# Patient Record
Sex: Male | Born: 1950 | Race: White | Hispanic: No | Marital: Married | Smoking: Never smoker
Health system: Southern US, Community
[De-identification: ages and names within clinical notes are randomized; demographics above are authoritative.]

## PROBLEM LIST (undated history)

## (undated) DIAGNOSIS — K219 Gastro-esophageal reflux disease without esophagitis: Secondary | ICD-10-CM

## (undated) DIAGNOSIS — H18609 Keratoconus, unspecified, unspecified eye: Secondary | ICD-10-CM

## (undated) DIAGNOSIS — K635 Polyp of colon: Secondary | ICD-10-CM

## (undated) DIAGNOSIS — E7800 Pure hypercholesterolemia, unspecified: Secondary | ICD-10-CM

## (undated) DIAGNOSIS — T7840XA Allergy, unspecified, initial encounter: Secondary | ICD-10-CM

## (undated) DIAGNOSIS — I82409 Acute embolism and thrombosis of unspecified deep veins of unspecified lower extremity: Secondary | ICD-10-CM

## (undated) DIAGNOSIS — J189 Pneumonia, unspecified organism: Secondary | ICD-10-CM

## (undated) DIAGNOSIS — J309 Allergic rhinitis, unspecified: Secondary | ICD-10-CM

## (undated) DIAGNOSIS — K648 Other hemorrhoids: Secondary | ICD-10-CM

## (undated) DIAGNOSIS — C189 Malignant neoplasm of colon, unspecified: Secondary | ICD-10-CM

## (undated) HISTORY — DX: Keratoconus, unspecified, unspecified eye: H18.609

## (undated) HISTORY — DX: Allergic rhinitis, unspecified: J30.9

## (undated) HISTORY — DX: Polyp of colon: K63.5

## (undated) HISTORY — DX: Malignant neoplasm of colon, unspecified: C18.9

## (undated) HISTORY — DX: Gastro-esophageal reflux disease without esophagitis: K21.9

## (undated) HISTORY — DX: Pure hypercholesterolemia, unspecified: E78.00

## (undated) HISTORY — PX: WISDOM TOOTH EXTRACTION: SHX21

## (undated) HISTORY — PX: COLONOSCOPY: SHX174

## (undated) HISTORY — PX: KNEE ARTHROSCOPY: SUR90

## (undated) HISTORY — DX: Other hemorrhoids: K64.8

## (undated) HISTORY — DX: Allergy, unspecified, initial encounter: T78.40XA

---

## 2001-08-15 ENCOUNTER — Encounter: Payer: Self-pay | Admitting: Family Medicine

## 2001-08-15 ENCOUNTER — Ambulatory Visit (HOSPITAL_COMMUNITY): Admission: RE | Admit: 2001-08-15 | Discharge: 2001-08-15 | Payer: Self-pay | Admitting: Family Medicine

## 2004-08-08 ENCOUNTER — Ambulatory Visit: Payer: Self-pay | Admitting: Internal Medicine

## 2006-10-08 ENCOUNTER — Ambulatory Visit: Payer: Self-pay | Admitting: Internal Medicine

## 2006-10-21 ENCOUNTER — Ambulatory Visit: Payer: Self-pay | Admitting: Internal Medicine

## 2013-08-03 ENCOUNTER — Telehealth: Payer: Self-pay | Admitting: Internal Medicine

## 2013-08-03 NOTE — Telephone Encounter (Signed)
Patient states he went to PCP for physical and had + hemoccult. Last colonoscopy 2008, normal exam, internal hems. Patient wants to be seen. Scheduled on 08/11/13 at 8:45 AM.

## 2013-08-04 ENCOUNTER — Encounter: Payer: Self-pay | Admitting: *Deleted

## 2013-08-05 ENCOUNTER — Encounter: Payer: Self-pay | Admitting: *Deleted

## 2013-08-11 ENCOUNTER — Encounter: Payer: Self-pay | Admitting: Internal Medicine

## 2013-08-11 ENCOUNTER — Ambulatory Visit (INDEPENDENT_AMBULATORY_CARE_PROVIDER_SITE_OTHER): Payer: 59 | Admitting: Internal Medicine

## 2013-08-11 VITALS — BP 128/70 | HR 60 | Ht 72.0 in | Wt 208.0 lb

## 2013-08-11 DIAGNOSIS — R195 Other fecal abnormalities: Secondary | ICD-10-CM

## 2013-08-11 MED ORDER — PREPOPIK 10-3.5-12 MG-GM-GM PO PACK: 1.0000 | PACK | ORAL | Status: DC

## 2013-08-11 NOTE — Progress Notes (Signed)
Jay Pace 09-09-1951 MRN [id number]  History of Present Illness:  This is a 62 year old white male with a positive home stool test for occult blood. He reports constipation when he did the cards. He denies any lower GI symptoms. There is no family history of colon cancer. His recent complete physical exam by Dr. Tiburcio Pea was normal. He had a hemoglobin of 15 and hematocrit of 42.2. We did a colonoscopy  in January 2008 for neoplastic screening and he was found to have small internal hemorrhoids. He denies hematochezia, rectal pain, abdominal pain or weight loss. He denies taking any anti-inflammatory medications.   Past Medical History  Diagnosis Date  . Internal hemorrhoids   . Hypercholesterolemia   . Allergic rhinitis   . Keratoconus    Past Surgical History  Procedure Laterality Date  . Knee arthroscopy Right     reports that he has never smoked. He has never used smokeless tobacco. He reports that he drinks alcohol. He reports that he does not use illicit drugs. family history includes Bladder Cancer in his father; Breast cancer in his sister and sister; Hypertension in his brother; Liver cancer in his brother; Ovarian cancer in his sister; Stomach cancer in his maternal grandmother; Stroke in his father. No Known Allergies      Review of Systems:  The remainder of the 10 point ROS is negative except as outlined in H&P   Physical Exam: General appearance  Well developed, in no distress. Eyes- non icteric. HEENT nontraumatic, normocephalic. Mouth no lesions, tongue papillated, no cheilosis. Neck supple without adenopathy, thyroid not enlarged, no carotid bruits, no JVD. Lungs Clear to auscultation bilaterally. Cor normal S1, normal S2, regular rhythm, no murmur,  quiet precordium. Abdomen: Soft nontender abdomen with normal active bowel sounds. No distention. Liver edge at costal margin. No scars. Rectal: Normal perianal cardia. Normal rectal sphincter tone. Moderate  amount of soft brown Hemoccult negative stool. No external hemorrhoids. Extremities no pedal edema. Skin no lesions. Neurological alert and oriented x 3. Psychological normal mood and affect.  Assessment and Plan:  Problem #45 62 year old, white male with positive Hemoccult cards but no visible blood per rectum. I could not reproduce the  heme-positive stool on my exam today. His last colonoscopy was 7 years ago. He has asymptomatic internal hemorrhoids. Because of the concern for possible neoplasm, either a polyp or cancer, we will proceed with a colonoscopy. I explained to him that there is no way for me to tell where the blood came from but I feel that it was most likely from an anorectal source due to straining or internal hemorrhoids. He agrees to go ahead with a colonoscopy just to be sure.   08/11/2013 Lina Sar

## 2013-08-11 NOTE — Addendum Note (Signed)
Addended by: Richardson Chiquito on: 08/11/2013 02:36 PM   Modules accepted: Orders

## 2013-08-11 NOTE — Patient Instructions (Signed)
You have been scheduled for a colonoscopy with propofol. Please follow written instructions given to you at your visit today.  Please pick up your prep kit at the pharmacy within the next 1-3 days. If you use inhalers (even only as needed), please bring them with you on the day of your procedure. Your physician has requested that you go to www.startemmi.com and enter the access code given to you at your visit today. This web site gives a general overview about your procedure. However, you should still follow specific instructions given to you by our office regarding your preparation for the procedure.  Cc: Dr Holley Bouche

## 2013-08-12 ENCOUNTER — Encounter: Payer: Self-pay | Admitting: Internal Medicine

## 2013-09-15 ENCOUNTER — Encounter: Payer: 59 | Admitting: Internal Medicine

## 2013-10-05 ENCOUNTER — Telehealth: Payer: Self-pay | Admitting: *Deleted

## 2013-10-05 ENCOUNTER — Ambulatory Visit (AMBULATORY_SURGERY_CENTER): Payer: 59 | Admitting: Internal Medicine

## 2013-10-05 ENCOUNTER — Other Ambulatory Visit: Payer: Self-pay | Admitting: *Deleted

## 2013-10-05 ENCOUNTER — Other Ambulatory Visit (INDEPENDENT_AMBULATORY_CARE_PROVIDER_SITE_OTHER): Payer: 59

## 2013-10-05 ENCOUNTER — Encounter: Payer: Self-pay | Admitting: Internal Medicine

## 2013-10-05 ENCOUNTER — Telehealth: Payer: Self-pay | Admitting: Internal Medicine

## 2013-10-05 VITALS — BP 151/76 | HR 56 | Temp 97.7°F | Resp 16 | Ht 72.0 in | Wt 208.0 lb

## 2013-10-05 DIAGNOSIS — R195 Other fecal abnormalities: Secondary | ICD-10-CM

## 2013-10-05 DIAGNOSIS — C189 Malignant neoplasm of colon, unspecified: Secondary | ICD-10-CM

## 2013-10-05 DIAGNOSIS — R933 Abnormal findings on diagnostic imaging of other parts of digestive tract: Secondary | ICD-10-CM

## 2013-10-05 DIAGNOSIS — D126 Benign neoplasm of colon, unspecified: Secondary | ICD-10-CM

## 2013-10-05 LAB — CBC WITH DIFFERENTIAL/PLATELET
Basophils Absolute: 0 10*3/uL (ref 0.0–0.1)
Basophils Relative: 0.3 % (ref 0.0–3.0)
Eosinophils Absolute: 0 10*3/uL (ref 0.0–0.7)
Eosinophils Relative: 0.5 % (ref 0.0–5.0)
HCT: 42.6 % (ref 39.0–52.0)
Hemoglobin: 14.6 g/dL (ref 13.0–17.0)
Lymphocytes Relative: 27.5 % (ref 12.0–46.0)
Lymphs Abs: 2.1 10*3/uL (ref 0.7–4.0)
MCHC: 34.3 g/dL (ref 30.0–36.0)
MCV: 94.3 fl (ref 78.0–100.0)
Monocytes Absolute: 0.6 10*3/uL (ref 0.1–1.0)
Monocytes Relative: 7.4 % (ref 3.0–12.0)
Neutro Abs: 5 10*3/uL (ref 1.4–7.7)
Neutrophils Relative %: 64.3 % (ref 43.0–77.0)
Platelets: 219 10*3/uL (ref 150.0–400.0)
RBC: 4.52 Mil/uL (ref 4.22–5.81)
RDW: 12.9 % (ref 11.5–14.6)
WBC: 7.8 10*3/uL (ref 4.5–10.5)

## 2013-10-05 LAB — COMPREHENSIVE METABOLIC PANEL
ALT: 21 U/L (ref 0–53)
AST: 16 U/L (ref 0–37)
Albumin: 4 g/dL (ref 3.5–5.2)
Alkaline Phosphatase: 65 U/L (ref 39–117)
BUN: 11 mg/dL (ref 6–23)
CO2: 28 mEq/L (ref 19–32)
Calcium: 8.7 mg/dL (ref 8.4–10.5)
Chloride: 106 mEq/L (ref 96–112)
Creatinine, Ser: 0.8 mg/dL (ref 0.4–1.5)
GFR: 104.1 mL/min (ref >60.00)
Glucose, Bld: 88 mg/dL (ref 70–99)
Potassium: 4.5 mEq/L (ref 3.5–5.1)
Sodium: 140 mEq/L (ref 135–145)
Total Bilirubin: 1 mg/dL (ref 0.3–1.2)
Total Protein: 7 g/dL (ref 6.0–8.3)

## 2013-10-05 LAB — IBC PANEL
Iron: 59 ug/dL (ref 42–165)
Saturation Ratios: 17.9 % — ABNORMAL LOW (ref 20.0–50.0)
Transferrin: 235.6 mg/dL (ref 212.0–360.0)

## 2013-10-05 MED ORDER — SODIUM CHLORIDE 0.9 % IV SOLN: 500.0000 mL | INTRAVENOUS | Status: DC

## 2013-10-05 NOTE — Progress Notes (Signed)
Called to room to assist during endoscopic procedure.  Patient ID and intended procedure confirmed with present staff. Received instructions for my participation in the procedure from the performing physician.  

## 2013-10-05 NOTE — Progress Notes (Signed)
Patient did not experience any of the following events: a burn prior to discharge; a fall within the facility; wrong site/side/patient/procedure/implant event; or a hospital transfer or hospital admission upon discharge from the facility. (G8907)Patient did not have preoperative order for IV antibiotic SSI prophylaxis. (G8918) ewm 

## 2013-10-05 NOTE — Progress Notes (Signed)
A/ox3 pleased with MAC, report to Marie RN 

## 2013-10-05 NOTE — Telephone Encounter (Signed)
Patient asking if he has any path, can it be sent to [facility] with Edmon Crape, RN and we cannot send it out to them due to a contract with [facility]. Patient aware.

## 2013-10-05 NOTE — Op Note (Signed)
[facility]  [address]. [place], [address]   COLONOSCOPY PROCEDURE REPORT  PATIENT: Jay Pace, Jay Pace  MR#: [id number] BIRTHDATE: Oct 22, 1950 , 62  yrs. old GENDER: Male ENDOSCOPIST: Lafayette Dragon, MD REFERRED IW:[id number] Kenton Kingfisher, M.D. PROCEDURE DATE:  10/05/2013 PROCEDURE:   Colonoscopy with snare polypectomy and Colonoscopy with cold biopsy polypectomy First Screening Colonoscopy - Avg.  risk and is 50 yrs.  old or older - No.  Prior Negative Screening - Now for repeat screening. Other: See Comments  History of Adenoma - Now for follow-up colonoscopy & has been > or = to 3 yrs.  N/A  Polyps Removed Today? Yes. ASA CLASS:   Class II INDICATIONS:heme-positive stool, prior colonoscopy in 1/ 2008 was normal except for hemorrhoids hemoglobin 15 ,no symptoms.   , MEDICATIONS: MAC sedation, administered by CRNA and propofol (Diprivan) 400mg  IV  DESCRIPTION OF PROCEDURE:   After the risks benefits and alternatives of the procedure were thoroughly explained, informed consent was obtained.  A digital rectal exam revealed no abnormalities of the rectum.   The LB [id number]  endoscope was introduced through the anus and advanced to the cecum, which was identified by both the appendix and ileocecal valve. No adverse events experienced.   The quality of the prep was good, using MoviPrep  The instrument was then slowly withdrawn as the colon was fully examined.      COLON FINDINGS: A fungating sessile polyp measuring 3 cm in size with a friable surface was found in the descending colon.at 70 cm polyp was friable and too large to snare in one piece. The base of the polyp was rather spread and a masslike A partial piecemeal polypectomy was performed using snare cautery and with cold forceps from the base of the polyp 2 rule out for cancer..  The resection was incomplete and the polyp tissue was  retrieved.there was small amount of bleeding from the polypectomy  site only about 50% of the polyp was removed the remaining 50% loss and masslike nodule or friable tissue at 70 cm covering the cardia of about 3 cmSpot tattoo total of 4 cc were applied to the lesion, also one Endo Clip was placed in the base of the polyp  Retroflexed views revealed no abnormalities. The time to cecum=4 minutes 10 seconds.  Withdrawal time=21 minutes 01 seconds.  The scope was withdrawn and the procedure completed. COMPLICATIONS: There were no complications.  ENDOSCOPIC IMPRESSION: Giant Sessile polyp measuring 3 cm in size was found in the descending colon;at 70 cm. polypectomy was performed using snare cautery and with cold forceps ,s/p Tattoo  and Endo clip placement  RECOMMENDATIONS: 1.  Await biopsy results 2.  pending on the pathology we will repeat the flexible sigmoidoscopy within the 4-6 weeks.  If there is a dysplasia or carcinoma he will be referred for surgical resection. We will proceed with CT scan of the abdomen and pelvis with IV and oral contrast Obtain baseline chemistries today Avoid aspirin and anti-inflammatory agents the next 2 weeks   eSigned:  Lafayette Dragon, MD 10/05/2013 4:21 PM   cc:   PATIENT NAME:  Jay Pace, Jay Pace MR#: [id number]

## 2013-10-05 NOTE — Telephone Encounter (Signed)
Per Dr Olevia Perches, patient needs CT scan abd, pelvis Left colon polyp versus mass/cancer. Labs today, CMET, CBC, Iron panel. Scheduled CT at [facility] on 10/08/13 at 2:30 PM. NPO 4 hours prior. Contrast at 12:30 PM and 1:30 PM. Lelan Pons in recovery at [facility] will give information to patient.

## 2013-10-05 NOTE — Patient Instructions (Addendum)
YOU HAD AN ENDOSCOPIC PROCEDURE TODAY AT [facility]: Refer to the procedure report that was given to you for any specific questions about what was found during the examination.  If the procedure report does not answer your questions, please call your gastroenterologist to clarify.  If you requested that your care partner not be given the details of your procedure findings, then the procedure report has been included in a sealed envelope for you to review at your convenience later.  YOU SHOULD EXPECT: Some feelings of bloating in the abdomen. Passage of more gas than usual.  Walking can help get rid of the air that was put into your GI tract during the procedure and reduce the bloating. If you had a lower endoscopy (such as a colonoscopy or flexible sigmoidoscopy) you may notice spotting of blood in your stool or on the toilet paper. If you underwent a bowel prep for your procedure, then you may not have a normal bowel movement for a few days.  DIET: Your first meal following the procedure should be a light meal and then it is ok to progress to your normal diet.  A half-sandwich or bowl of soup is an example of a good first meal.  Heavy or fried foods are harder to digest and may make you feel nauseous or bloated.  Likewise meals heavy in dairy and vegetables can cause extra gas to form and this can also increase the bloating.  Drink plenty of fluids but you should avoid alcoholic beverages for 24 hours.  ACTIVITY: Your care partner should take you home directly after the procedure.  You should plan to take it easy, moving slowly for the rest of the day.  You can resume normal activity the day after the procedure however you should NOT DRIVE or use heavy machinery for 24 hours (because of the sedation medicines used during the test).    SYMPTOMS TO REPORT IMMEDIATELY: A gastroenterologist can be reached at any hour.  During normal business hours, 8:30 AM to 5:00 PM Monday through Friday,  call [phone].  After hours and on weekends, please call the GI answering service at [phone]  Emergency number who will take a message and have the physician on call contact you.   Following lower endoscopy (colonoscopy or flexible sigmoidoscopy):  Excessive amounts of blood in the stool  Significant tenderness or worsening of abdominal pains  Swelling of the abdomen that is new, acute  Fever of 100F or higher  FOLLOW UP: If any biopsies were taken you will be contacted by phone or by letter within the next 1-3 weeks.  Call your gastroenterologist if you have not heard about the biopsies in 3 weeks.  Our staff will call the home number listed on your records the next business day following your procedure to check on you and address any questions or concerns that you may have at that time regarding the information given to you following your procedure. This is a courtesy call and so if there is no answer at the home number and we have not heard from you through the emergency physician on call, we will assume that you have returned to your regular daily activities without incident.  SIGNATURES/CONFIDENTIALITY: You and/or your care partner have signed paperwork which will be entered into your electronic medical record.  These signatures attest to the fact that that the information above on your After Visit Summary has been reviewed and is understood.  Full responsibility of the  confidentiality of this discharge information lies with you and/or your care-partner.   You will need to have labs drawn today before discharge home due to needing kidney function results prior to the CT scan Friday  We have scheduled you a CT scan for Friday 10-08-2013 at 230 pm at [facility] on [address] 3rd floor.  The same building as [facility] cardiology..see attached map.  Please follow the ct form schedule for drinking the contrast.  You have a  Metal clip at 70 cm in the colon.  Please let anyone be aware of this with xrays,scans, mri's  etc.  This will pass in the stool in the next 2 weeks. You may see this or not. Carry your card for the next month.  No aspirin, aspirin containing  Products or anti inflammatory medicines for the next 2 weeks. You may use tylenol only for pain the next 2 weeks. You may resume these medicines on 10-19-2013.

## 2013-10-06 ENCOUNTER — Telehealth: Payer: Self-pay | Admitting: *Deleted

## 2013-10-06 NOTE — Telephone Encounter (Signed)
  Follow up Call-  Call back number 10/05/2013  Post procedure Call Back phone  # [phone]  Permission to leave phone message Yes     Patient questions:  Do you have a fever, pain , or abdominal swelling? no Pain Score  0 *  Have you tolerated food without any problems? yes  Have you been able to return to your normal activities? yes  Do you have any questions about your discharge instructions: Diet   no Medications  no Follow up visit  no  Do you have questions or concerns about your Care? no  Actions: * If pain score is 4 or above: No action needed, pain <4.

## 2013-10-08 ENCOUNTER — Ambulatory Visit (INDEPENDENT_AMBULATORY_CARE_PROVIDER_SITE_OTHER)
Admission: RE | Admit: 2013-10-08 | Discharge: 2013-10-08 | Disposition: A | Discharge status: Home / Self Care | Payer: 59 | Source: Ambulatory Visit | Attending: Internal Medicine | Admitting: Internal Medicine

## 2013-10-08 ENCOUNTER — Telehealth: Payer: Self-pay | Admitting: Internal Medicine

## 2013-10-08 DIAGNOSIS — R933 Abnormal findings on diagnostic imaging of other parts of digestive tract: Secondary | ICD-10-CM

## 2013-10-08 MED ORDER — IOHEXOL 300 MG/ML  SOLN
100.0000 mL | Freq: Once | INTRAMUSCULAR | Status: AC | PRN
  Administered 2013-10-08: 100 mL via INTRAVENOUS

## 2013-10-08 NOTE — Telephone Encounter (Signed)
Left a message for patient to call me. 

## 2013-10-08 NOTE — Telephone Encounter (Signed)
Awaiting path report.

## 2013-10-08 NOTE — Telephone Encounter (Signed)
Spoke with patient and told him the results are not back yet. He just wants Dr. Olevia Perches to know he is anxious to hear the results.

## 2013-10-12 ENCOUNTER — Other Ambulatory Visit: Payer: Self-pay | Admitting: *Deleted

## 2013-10-12 ENCOUNTER — Encounter: Payer: Self-pay | Admitting: Internal Medicine

## 2013-10-12 DIAGNOSIS — C801 Malignant (primary) neoplasm, unspecified: Secondary | ICD-10-CM

## 2013-10-13 ENCOUNTER — Encounter: Payer: Self-pay | Admitting: *Deleted

## 2013-10-20 ENCOUNTER — Other Ambulatory Visit (INDEPENDENT_AMBULATORY_CARE_PROVIDER_SITE_OTHER): Payer: Self-pay | Admitting: General Surgery

## 2013-10-20 ENCOUNTER — Ambulatory Visit (INDEPENDENT_AMBULATORY_CARE_PROVIDER_SITE_OTHER): Payer: 59 | Admitting: General Surgery

## 2013-10-20 ENCOUNTER — Encounter (INDEPENDENT_AMBULATORY_CARE_PROVIDER_SITE_OTHER): Payer: Self-pay

## 2013-10-20 ENCOUNTER — Telehealth (INDEPENDENT_AMBULATORY_CARE_PROVIDER_SITE_OTHER): Payer: Self-pay | Admitting: *Deleted

## 2013-10-20 ENCOUNTER — Encounter (INDEPENDENT_AMBULATORY_CARE_PROVIDER_SITE_OTHER): Payer: Self-pay | Admitting: General Surgery

## 2013-10-20 VITALS — BP 148/80 | HR 56 | Temp 98.2°F | Resp 14 | Ht 72.0 in | Wt 213.8 lb

## 2013-10-20 DIAGNOSIS — C189 Malignant neoplasm of colon, unspecified: Secondary | ICD-10-CM

## 2013-10-20 NOTE — Telephone Encounter (Signed)
I spoke with pt and informed him of the appt for his MRI at [facility] on 10/25/13 with an arrival time of 7:45pm.  I instructed pt to be NPO 4 hours prior to scan.  Pt is agreeable with this appt.

## 2013-10-20 NOTE — Patient Instructions (Signed)
We will call you with the results of the MRI.    CENTRAL  SURGERY  ONE-DAY (1) PRE-OP HOME COLON PREP INSTRUCTIONS: ** MIRALAX / GATORADE PREP **  Fill the two prescriptions at a pharmacy of your choice.  You must follow the instructions below carefully.  If you have questions or problems, please call and speak to someone in the clinic department at our office:   [phone].  MIRALAX - GATORADE -- DULCOLAX TABS:   Fill the prescriptions for MIRALAX  (255 gm bottle)    In addition, purchase four (4) DULCOLAX TABLETS (no prescription required), and one 64 oz GATORADE.  (Do NOT purchase red Gatorade; any other flavor is acceptable).  ANITIBIOTICS:   There will be 2 different antibiotics.     Take both prescriptions THE AFTERNOON BEFORE your surgery, at the times written on the bottles.  INSTRUCTIONS: 1. Five days prior to your procedure do not eat nuts, popcorn, or fruit with seeds.  Stop all fiber supplements such as Metamucil, Citrucel, etc.  2. The day before your procedure: o 6:00am:  take (4) Dulcolax tablets.  You should remain on clear liquids for the entire day.   CLEAR LIQUIDS: clear bouillon, broth, jello (NOT RED), black coffee, tea, soda, etc o 10:00am:  add the bottle of MiraLax to the 64-oz bottle of Gatorade, and dissolve.  Begin drinking the Gatorade mixture until gone (8 oz every 15-30 minutes).  Continue clear liquids until midnight (or bedtime). o Take the antibiotics at the times instructed on the bottles.  3. The day of your procedure:   Do not eat or drink ANYTHING after midnight before your surgery.     If you take Heart or Blood Pressure medicine, ask the pre-op nurses about these during your preop appointment.   Further pre-operative instructions will be given to you from the hospital.   Expect to be contacted 5-7 days before your surgery.

## 2013-10-20 NOTE — Progress Notes (Signed)
Patient ID: Jay Pace, male   DOB: June 17, 1951, 63 y.o.   MRN: [id number]  Chief Complaint  Patient presents with  . New Evaluation    eval adeno carcinoma / colon resection?    HPI Jay Pace is a 63 y.o. male.   HPI  He is referred by Dr. Olevia Perches for further evaluation and treatment of a new diagnosed left colon cancer.  He was noted to have Hemoccult positive stool on the Hemoccult cards given to him and his annual physical exam by Dr. Kenton Kingfisher. He went and had a colonoscopy by Dr. Olevia Perches.  This demonstrated a large polypoid mass in the left colon area near the splenic flexure. 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 A tattoo was placed around the area as well as a clip. A CT scan was performed. This demonstrated a small kidney cyst. There is a large prostate gland. There were small lesions in the liver that could not be characterized. It was recommended that if the biopsy was positive for malignancy an MRI be performed to better characterize these lesions. He has been feeling well and eating well. No decrease in energy. No change in bowel habits.  There is no family history of colon cancer. He is here with his wife.  Past Medical History  Diagnosis Date  . Internal hemorrhoids   . Hypercholesterolemia   . Allergic rhinitis   . Keratoconus     Past Surgical History  Procedure Laterality Date  . Knee arthroscopy Right     Family History  Problem Relation Age of Onset  . Stroke Father   . Bladder Cancer Father   . Cancer Father     bladder  . Breast cancer Sister   . Cancer Sister     breast and ovarian  . Hypertension Brother   . Stomach cancer Maternal Grandmother   . Liver cancer Brother   . Ovarian cancer Sister   . Cancer Sister     breast  . Breast cancer Sister     x 2    Social History History  Substance Use Topics  . Smoking status: Never Smoker   . Smokeless tobacco: Never Used  . Alcohol Use: No     Comment: rare    No Known  Allergies  No current outpatient prescriptions on file.   No current facility-administered medications for this visit.    Review of Systems Review of Systems  Constitutional: Negative.   HENT: Positive for congestion.   Respiratory: Negative.   Cardiovascular: Negative.   Gastrointestinal: Positive for blood in stool. Negative for abdominal pain.  Genitourinary:       Nocturia twice a night. Slight elevation in PSA. Dr. Kenton Kingfisher recommended he see a urologist but he did not want to do that at the time.  Musculoskeletal: Negative.   Skin: Negative.   Neurological: Negative.   Hematological: Negative.     Blood pressure 148/80, pulse 56, temperature 98.2 F (36.8 C), temperature source Temporal, resp. rate 14, height 6' (1.829 m), weight 213 lb 12.8 oz (96.979 kg).  Physical Exam Physical Exam  Constitutional: He appears well-developed and well-nourished. No distress.  HENT:  Head: Normocephalic and atraumatic.  Eyes: No scleral icterus.  Cardiovascular: Normal rate and regular rhythm.   Pulmonary/Chest: Effort normal and breath sounds normal.  Abdominal: Soft. Bowel sounds are normal. He exhibits no mass. There is no tenderness.  Musculoskeletal: He exhibits no edema.  Lymphadenopathy:    He has no cervical adenopathy.  Neurological: He is alert.  Skin: Skin is warm and dry.  Psychiatric: He has a normal mood and affect. His behavior is normal.    Data Reviewed Colonoscopy report. Note from Dr. Olevia Perches. CT scan and CT report.  Assessment    Newly diagnosed left colon cancer. Indeterminate liver lesions on CT scan. Symptoms of BPH as well as enlarged prostate and slightly elevated PSA.     Plan    I recommended an MRI to further characterize the liver lesions. If one of them looked suspicious, may need to get an image guided biopsy. I also recommended that he re-consider seeing a urologist.   As for the newly diagnosed colon cancer, I recommend laparoscopic partial  colectomy.  I have explained the procedure and risks of colon resection.  Risks include but are not limited to bleeding, infection, wound problems, anesthesia, anastomotic leak, need for colostomy, injury to intraabominal organs (such as intestine, spleen, kidney, bladder, ureter, etc.), ileus, irregular bowel habits.  He seems to understand and agrees with the plan.  Will await the results of the MRI.       Jay Pace J 10/20/2013, 12:44 PM

## 2013-10-21 ENCOUNTER — Telehealth (INDEPENDENT_AMBULATORY_CARE_PROVIDER_SITE_OTHER): Payer: Self-pay

## 2013-10-21 ENCOUNTER — Telehealth: Payer: Self-pay | Admitting: Internal Medicine

## 2013-10-21 NOTE — Telephone Encounter (Signed)
No problem with the clip. I have talked to the radiologist Dr Kris Hartmann and he says titanium metal clips don/t interfere with MRI. The concern that the clips could be displaced by the magnet in MRI  and cause  damage to the tissue they hold together, is not a concern because this clip  Is there just for marking the site of the tumor.

## 2013-10-21 NOTE — Telephone Encounter (Signed)
Pt reports that he has a "metal clip" placed by Dr. Olevia Perches on 10/05/13.  Her office confirmed.  Per Dr. Olevia Perches, clip is titanium which is safe for MRI.  Pt made aware.

## 2013-10-21 NOTE — Telephone Encounter (Signed)
Spoke with Jearld Fenton at Dr. Bertrum Sol office. She states he wants the patient to have an MRI. Patient had an endo clip placed on polyp on 10/05/13. She is asking if he can have an MRI since this was placed and if not, when could he have one. Please, advise

## 2013-10-25 ENCOUNTER — Ambulatory Visit
Admission: RE | Admit: 2013-10-25 | Discharge: 2013-10-25 | Disposition: A | Discharge status: Home / Self Care | Payer: 59 | Source: Ambulatory Visit | Attending: General Surgery | Admitting: General Surgery

## 2013-10-25 DIAGNOSIS — C189 Malignant neoplasm of colon, unspecified: Secondary | ICD-10-CM

## 2013-10-25 MED ORDER — GADOBENATE DIMEGLUMINE 529 MG/ML IV SOLN
20.0000 mL | Freq: Once | INTRAVENOUS | Status: AC | PRN
  Administered 2013-10-25: 20 mL via INTRAVENOUS

## 2013-10-26 ENCOUNTER — Encounter (INDEPENDENT_AMBULATORY_CARE_PROVIDER_SITE_OTHER): Payer: Self-pay | Admitting: General Surgery

## 2013-10-26 ENCOUNTER — Telehealth (INDEPENDENT_AMBULATORY_CARE_PROVIDER_SITE_OTHER): Payer: Self-pay

## 2013-10-26 NOTE — Progress Notes (Signed)
Patient ID: Jay Pace, male   DOB: March 12, 1951, 63 y.o.   MRN: [id number] The MRI of the liver shows that the lesions are most consistent with benign hemangiomas. No evidence of metastatic disease. I discussed this with him. He still working on trying to get into see a urologist. I spoke with him about going ahead and scheduling his operation and he is in agreement with this.

## 2013-10-26 NOTE — Telephone Encounter (Signed)
LMOV to call.  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  No cancer.

## 2013-11-01 ENCOUNTER — Encounter (HOSPITAL_COMMUNITY): Payer: Self-pay | Admitting: Pharmacy Technician

## 2013-11-02 ENCOUNTER — Other Ambulatory Visit (INDEPENDENT_AMBULATORY_CARE_PROVIDER_SITE_OTHER): Payer: Self-pay | Admitting: General Surgery

## 2013-11-02 NOTE — Progress Notes (Signed)
Dr Zella Richer -- Consent order stated [facility].     Not overstepping my bounds, but did you want colectoly added?  If so, please put in EPIC.  If not, Thank you.  Has PST APPT 11/03/13

## 2013-11-02 NOTE — Patient Instructions (Addendum)
      Your procedure is scheduled on:  11/09/13  TUESDAY  Report to [facility] at  0730     AM.  Call this number if you have problems the morning of surgery: [phone]        BOWEL PREP AS PER OFFICE  Do not take anything by mouth :After Midnight. Monday NIGHT   Take these medicines the morning of surgery with A SIP OF WATER: NONE CONTACTS MUST BE REMOVED BEFORE SURGERY  .  Contacts, dentures or partial plates, or metal hairpins  can not be worn to surgery. Your family will be responsible for glasses, dentures, hearing aides while you are in surgery  Leave suitcase in the car. After surgery it may be brought to your room.  For patients admitted to the hospital, checkout time is 11:00 AM day of  discharge.                DO NOT WEAR JEWELRY, LOTIONS, POWDERS, OR PERFUMES.  WOMEN-- DO NOT SHAVE LEGS OR UNDERARMS FOR 48 HOURS BEFORE SHOWERS. MEN MAY SHAVE FACE.  Patients discharged the day of surgery will not be allowed to drive home. IF going home the day of surgery, you must have a driver and someone to stay with you for the first 24 hours  Name and phone number of your driver:                                                                        Please read over the following fact sheets that you were given: Blood Transfusion Sheet  Information    Carnie Bruemmer  PST 336  [phone]                 [place]                                                  Patient Signature _____________________________

## 2013-11-03 ENCOUNTER — Encounter (HOSPITAL_COMMUNITY)
Admission: RE | Admit: 2013-11-03 | Discharge: 2013-11-03 | Disposition: A | Discharge status: Home / Self Care | Payer: 59 | Source: Ambulatory Visit | Attending: General Surgery | Admitting: General Surgery

## 2013-11-03 ENCOUNTER — Ambulatory Visit (HOSPITAL_COMMUNITY)
Admission: RE | Admit: 2013-11-03 | Discharge: 2013-11-03 | Disposition: A | Discharge status: Home / Self Care | Payer: 59 | Source: Ambulatory Visit | Attending: General Surgery | Admitting: General Surgery

## 2013-11-03 ENCOUNTER — Encounter (HOSPITAL_COMMUNITY): Payer: Self-pay

## 2013-11-03 DIAGNOSIS — Z01818 Encounter for other preprocedural examination: Secondary | ICD-10-CM | POA: Insufficient documentation

## 2013-11-03 DIAGNOSIS — C189 Malignant neoplasm of colon, unspecified: Secondary | ICD-10-CM | POA: Insufficient documentation

## 2013-11-03 DIAGNOSIS — Z01812 Encounter for preprocedural laboratory examination: Secondary | ICD-10-CM | POA: Insufficient documentation

## 2013-11-03 LAB — COMPREHENSIVE METABOLIC PANEL
ALT: 19 U/L (ref 0–53)
AST: 16 U/L (ref 0–37)
Albumin: 4.1 g/dL (ref 3.5–5.2)
Alkaline Phosphatase: 68 U/L (ref 39–117)
BUN: 16 mg/dL (ref 6–23)
CO2: 27 mEq/L (ref 19–32)
Calcium: 9.4 mg/dL (ref 8.4–10.5)
Chloride: 104 mEq/L (ref 96–112)
Creatinine, Ser: 0.8 mg/dL (ref 0.50–1.35)
GFR calc Af Amer: >90 mL/min (ref >90)
GFR calc non Af Amer: >90 mL/min (ref >90)
Glucose, Bld: 97 mg/dL (ref 70–99)
Potassium: 4.1 mEq/L (ref 3.7–5.3)
Sodium: 141 mEq/L (ref 137–147)
Total Bilirubin: 0.5 mg/dL (ref 0.3–1.2)
Total Protein: 7.5 g/dL (ref 6.0–8.3)

## 2013-11-03 LAB — ABO/RH: ABO/RH(D): A POS

## 2013-11-03 LAB — CBC WITH DIFFERENTIAL/PLATELET
Basophils Absolute: 0 10*3/uL (ref 0.0–0.1)
Basophils Relative: 0 % (ref 0–1)
Eosinophils Absolute: 0.1 10*3/uL (ref 0.0–0.7)
Eosinophils Relative: 2 % (ref 0–5)
HCT: 44.1 % (ref 39.0–52.0)
Hemoglobin: 15 g/dL (ref 13.0–17.0)
Lymphocytes Relative: 35 % (ref 12–46)
Lymphs Abs: 1.6 10*3/uL (ref 0.7–4.0)
MCH: 32.3 pg (ref 26.0–34.0)
MCHC: 34 g/dL (ref 30.0–36.0)
MCV: 94.8 fL (ref 78.0–100.0)
Monocytes Absolute: 0.3 10*3/uL (ref 0.1–1.0)
Monocytes Relative: 7 % (ref 3–12)
Neutro Abs: 2.7 10*3/uL (ref 1.7–7.7)
Neutrophils Relative %: 57 % (ref 43–77)
Platelets: 232 10*3/uL (ref 150–400)
RBC: 4.65 MIL/uL (ref 4.22–5.81)
RDW: 12.7 % (ref 11.5–15.5)
WBC: 4.7 10*3/uL (ref 4.0–10.5)

## 2013-11-03 LAB — PROTIME-INR
INR: 0.96 (ref 0.00–1.49)
Prothrombin Time: 12.6 seconds (ref 11.6–15.2)

## 2013-11-03 LAB — CEA: CEA: 3 ng/mL (ref 0.0–5.0)

## 2013-11-04 ENCOUNTER — Telehealth (INDEPENDENT_AMBULATORY_CARE_PROVIDER_SITE_OTHER): Payer: Self-pay | Admitting: *Deleted

## 2013-11-04 ENCOUNTER — Telehealth (INDEPENDENT_AMBULATORY_CARE_PROVIDER_SITE_OTHER): Payer: Self-pay

## 2013-11-04 NOTE — Telephone Encounter (Signed)
Patient called to ask about the antibiotics to go along with his bowel prep.  Explained that we will check with Dr. Zella Richer this afternoon then let him know.  Patient states understanding and agreeable at this time.

## 2013-11-04 NOTE — Telephone Encounter (Signed)
LM for pt that we would mail the Rx for preop antibiotics today.

## 2013-11-04 NOTE — Telephone Encounter (Signed)
Needs Erythromycin and Neomycin.  Jay Pace should have the pre-printed prescription.

## 2013-11-05 NOTE — Telephone Encounter (Signed)
Pt returned call and advised Rx is in the mail to him. Pt to call 8:30 am Monday if he did not received Rx.

## 2013-11-08 MED ORDER — DEXTROSE 5 % IV SOLN
2.0000 g | Freq: Once | INTRAVENOUS | Status: DC
  Filled 2013-11-08: qty 2

## 2013-11-09 ENCOUNTER — Encounter (HOSPITAL_COMMUNITY)
Admission: RE | Disposition: A | Discharge status: Home / Self Care | Payer: Self-pay | Source: Ambulatory Visit | Attending: General Surgery

## 2013-11-09 ENCOUNTER — Encounter (HOSPITAL_COMMUNITY): Payer: Self-pay | Admitting: *Deleted

## 2013-11-09 ENCOUNTER — Inpatient Hospital Stay (HOSPITAL_COMMUNITY): Payer: 59 | Admitting: Registered Nurse

## 2013-11-09 ENCOUNTER — Encounter (HOSPITAL_COMMUNITY): Payer: 59 | Admitting: Registered Nurse

## 2013-11-09 ENCOUNTER — Inpatient Hospital Stay (HOSPITAL_COMMUNITY)
Admission: RE | Admit: 2013-11-09 | Discharge: 2013-11-13 | DRG: 330 | Disposition: A | Discharge status: Home / Self Care | Payer: 59 | Source: Ambulatory Visit | Attending: General Surgery | Admitting: General Surgery

## 2013-11-09 DIAGNOSIS — Z800 Family history of malignant neoplasm of digestive organs: Secondary | ICD-10-CM

## 2013-11-09 DIAGNOSIS — R195 Other fecal abnormalities: Secondary | ICD-10-CM | POA: Diagnosis present

## 2013-11-09 DIAGNOSIS — C189 Malignant neoplasm of colon, unspecified: Secondary | ICD-10-CM

## 2013-11-09 DIAGNOSIS — E7800 Pure hypercholesterolemia, unspecified: Secondary | ICD-10-CM | POA: Diagnosis present

## 2013-11-09 DIAGNOSIS — K7689 Other specified diseases of liver: Secondary | ICD-10-CM | POA: Diagnosis present

## 2013-11-09 DIAGNOSIS — C186 Malignant neoplasm of descending colon: Principal | ICD-10-CM | POA: Diagnosis present

## 2013-11-09 DIAGNOSIS — Z8052 Family history of malignant neoplasm of bladder: Secondary | ICD-10-CM

## 2013-11-09 DIAGNOSIS — K560 Paralytic ileus: Secondary | ICD-10-CM | POA: Diagnosis not present

## 2013-11-09 DIAGNOSIS — Q619 Cystic kidney disease, unspecified: Secondary | ICD-10-CM

## 2013-11-09 HISTORY — PX: LAPAROSCOPIC PARTIAL COLECTOMY: SHX5907

## 2013-11-09 LAB — TYPE AND SCREEN
ABO/RH(D): A POS
Antibody Screen: NEGATIVE

## 2013-11-09 SURGERY — LAPAROSCOPIC PARTIAL COLECTOMY
Anesthesia: General | Site: Abdomen

## 2013-11-09 MED ORDER — DIPHENHYDRAMINE HCL 50 MG/ML IJ SOLN
12.5000 mg | Freq: Four times a day (QID) | INTRAMUSCULAR | Status: DC | PRN
  Administered 2013-11-10: 12.5 mg via INTRAVENOUS
  Filled 2013-11-09: qty 1

## 2013-11-09 MED ORDER — ACETAMINOPHEN 10 MG/ML IV SOLN
1000.0000 mg | Freq: Once | INTRAVENOUS | Status: AC
  Administered 2013-11-09: 1000 mg via INTRAVENOUS
  Filled 2013-11-09 (×2): qty 100

## 2013-11-09 MED ORDER — NEOSTIGMINE METHYLSULFATE 1 MG/ML IJ SOLN
INTRAMUSCULAR | Status: DC | PRN
  Administered 2013-11-09: 5 mg via INTRAVENOUS

## 2013-11-09 MED ORDER — EPHEDRINE SULFATE 50 MG/ML IJ SOLN
INTRAMUSCULAR | Status: DC | PRN
  Administered 2013-11-09: 5 mg via INTRAVENOUS

## 2013-11-09 MED ORDER — ONDANSETRON HCL 4 MG PO TABS: 4.0000 mg | ORAL_TABLET | Freq: Four times a day (QID) | ORAL | Status: DC | PRN

## 2013-11-09 MED ORDER — GLYCOPYRROLATE 0.2 MG/ML IJ SOLN
INTRAMUSCULAR | Status: AC
  Filled 2013-11-09: qty 3

## 2013-11-09 MED ORDER — PROPOFOL 10 MG/ML IV BOLUS
INTRAVENOUS | Status: DC | PRN
  Administered 2013-11-09: 200 mg via INTRAVENOUS

## 2013-11-09 MED ORDER — ONDANSETRON HCL 4 MG/2ML IJ SOLN
INTRAMUSCULAR | Status: AC
  Filled 2013-11-09: qty 2

## 2013-11-09 MED ORDER — DIPHENHYDRAMINE HCL 12.5 MG/5ML PO ELIX: 12.5000 mg | ORAL_SOLUTION | Freq: Four times a day (QID) | ORAL | Status: DC | PRN

## 2013-11-09 MED ORDER — TAMSULOSIN HCL 0.4 MG PO CAPS
0.4000 mg | ORAL_CAPSULE | Freq: Every day | ORAL | Status: DC
Start: 2013-11-09 — End: 2013-11-13
  Administered 2013-11-09 – 2013-11-12 (×4): 0.4 mg via ORAL
  Filled 2013-11-09 (×5): qty 1

## 2013-11-09 MED ORDER — PANTOPRAZOLE SODIUM 40 MG IV SOLR
40.0000 mg | INTRAVENOUS | Status: DC
  Administered 2013-11-09 – 2013-11-12 (×4): 40 mg via INTRAVENOUS
  Filled 2013-11-09 (×5): qty 40

## 2013-11-09 MED ORDER — LACTATED RINGERS IR SOLN
Status: DC | PRN
  Administered 2013-11-09: 1

## 2013-11-09 MED ORDER — MORPHINE SULFATE (PF) 1 MG/ML IV SOLN
INTRAVENOUS | Status: AC
  Filled 2013-11-09: qty 25

## 2013-11-09 MED ORDER — NALOXONE HCL 0.4 MG/ML IJ SOLN: 0.4000 mg | INTRAMUSCULAR | Status: DC | PRN

## 2013-11-09 MED ORDER — SODIUM CHLORIDE 0.9 % IJ SOLN: 9.0000 mL | INTRAMUSCULAR | Status: DC | PRN

## 2013-11-09 MED ORDER — CISATRACURIUM BESYLATE 20 MG/10ML IV SOLN
INTRAVENOUS | Status: AC
  Filled 2013-11-09: qty 10

## 2013-11-09 MED ORDER — HYDROMORPHONE HCL PF 1 MG/ML IJ SOLN: 0.2500 mg | INTRAMUSCULAR | Status: DC | PRN

## 2013-11-09 MED ORDER — HYDROMORPHONE HCL PF 1 MG/ML IJ SOLN
INTRAMUSCULAR | Status: DC | PRN
  Administered 2013-11-09 (×2): 1 mg via INTRAVENOUS

## 2013-11-09 MED ORDER — MIDAZOLAM HCL 5 MG/5ML IJ SOLN
INTRAMUSCULAR | Status: DC | PRN
  Administered 2013-11-09 (×2): 1 mg via INTRAVENOUS

## 2013-11-09 MED ORDER — ONDANSETRON HCL 4 MG/2ML IJ SOLN
INTRAMUSCULAR | Status: DC | PRN
  Administered 2013-11-09: 4 mg via INTRAVENOUS

## 2013-11-09 MED ORDER — MIDAZOLAM HCL 2 MG/2ML IJ SOLN
INTRAMUSCULAR | Status: AC
  Filled 2013-11-09: qty 2

## 2013-11-09 MED ORDER — FENTANYL CITRATE 0.05 MG/ML IJ SOLN
INTRAMUSCULAR | Status: DC | PRN
  Administered 2013-11-09 (×2): 50 ug via INTRAVENOUS
  Administered 2013-11-09: 100 ug via INTRAVENOUS
  Administered 2013-11-09 (×6): 50 ug via INTRAVENOUS

## 2013-11-09 MED ORDER — KCL-LACTATED RINGERS-D5W 20 MEQ/L IV SOLN
INTRAVENOUS | Status: DC
  Administered 2013-11-09 – 2013-11-11 (×4): via INTRAVENOUS
  Administered 2013-11-12: 20 mL via INTRAVENOUS
  Filled 2013-11-09 (×9): qty 1000

## 2013-11-09 MED ORDER — 0.9 % SODIUM CHLORIDE (POUR BTL) OPTIME
TOPICAL | Status: DC | PRN
  Administered 2013-11-09: 1000 mL

## 2013-11-09 MED ORDER — HEPARIN SODIUM (PORCINE) 5000 UNIT/ML IJ SOLN
5000.0000 [IU] | Freq: Three times a day (TID) | INTRAMUSCULAR | Status: DC
Start: 2013-11-10 — End: 2013-11-13
  Administered 2013-11-10 – 2013-11-13 (×10): 5000 [IU] via SUBCUTANEOUS
  Filled 2013-11-09 (×13): qty 1

## 2013-11-09 MED ORDER — LIDOCAINE HCL (CARDIAC) 20 MG/ML IV SOLN
INTRAVENOUS | Status: AC
  Filled 2013-11-09: qty 5

## 2013-11-09 MED ORDER — ALVIMOPAN 12 MG PO CAPS
12.0000 mg | ORAL_CAPSULE | Freq: Once | ORAL | Status: AC
  Administered 2013-11-09: 12 mg via ORAL
  Filled 2013-11-09: qty 1

## 2013-11-09 MED ORDER — CISATRACURIUM BESYLATE (PF) 10 MG/5ML IV SOLN
INTRAVENOUS | Status: DC | PRN
  Administered 2013-11-09: 6 mg via INTRAVENOUS
  Administered 2013-11-09: 4 mg via INTRAVENOUS
  Administered 2013-11-09: 2 mg via INTRAVENOUS
  Administered 2013-11-09: 10 mg via INTRAVENOUS

## 2013-11-09 MED ORDER — LACTATED RINGERS IV SOLN: INTRAVENOUS | Status: DC

## 2013-11-09 MED ORDER — ONDANSETRON HCL 4 MG/2ML IJ SOLN: 4.0000 mg | INTRAMUSCULAR | Status: DC | PRN

## 2013-11-09 MED ORDER — BUPIVACAINE HCL (PF) 0.5 % IJ SOLN
INTRAMUSCULAR | Status: AC
  Filled 2013-11-09: qty 30

## 2013-11-09 MED ORDER — ONDANSETRON HCL 4 MG/2ML IJ SOLN: 4.0000 mg | Freq: Four times a day (QID) | INTRAMUSCULAR | Status: DC | PRN

## 2013-11-09 MED ORDER — LIDOCAINE HCL (CARDIAC) 20 MG/ML IV SOLN
INTRAVENOUS | Status: DC | PRN
  Administered 2013-11-09: 100 mg via INTRAVENOUS

## 2013-11-09 MED ORDER — DEXAMETHASONE SODIUM PHOSPHATE 10 MG/ML IJ SOLN
INTRAMUSCULAR | Status: DC | PRN
  Administered 2013-11-09: 10 mg via INTRAVENOUS

## 2013-11-09 MED ORDER — NEOSTIGMINE METHYLSULFATE 1 MG/ML IJ SOLN
INTRAMUSCULAR | Status: AC
  Filled 2013-11-09: qty 10

## 2013-11-09 MED ORDER — BUPIVACAINE HCL (PF) 0.5 % IJ SOLN
INTRAMUSCULAR | Status: DC | PRN
  Administered 2013-11-09: 5 mL

## 2013-11-09 MED ORDER — CEFOTETAN DISODIUM-DEXTROSE 2-2.08 GM-% IV SOLR
INTRAVENOUS | Status: AC
  Filled 2013-11-09: qty 50

## 2013-11-09 MED ORDER — HYDROMORPHONE HCL PF 2 MG/ML IJ SOLN
INTRAMUSCULAR | Status: AC
  Filled 2013-11-09: qty 1

## 2013-11-09 MED ORDER — ALVIMOPAN 12 MG PO CAPS
12.0000 mg | ORAL_CAPSULE | Freq: Two times a day (BID) | ORAL | Status: DC
  Administered 2013-11-10 – 2013-11-12 (×6): 12 mg via ORAL
  Filled 2013-11-09 (×8): qty 1

## 2013-11-09 MED ORDER — LACTATED RINGERS IV SOLN
INTRAVENOUS | Status: DC | PRN
  Administered 2013-11-09 (×4): via INTRAVENOUS

## 2013-11-09 MED ORDER — FENTANYL CITRATE 0.05 MG/ML IJ SOLN
INTRAMUSCULAR | Status: AC
  Filled 2013-11-09: qty 5

## 2013-11-09 MED ORDER — DEXAMETHASONE SODIUM PHOSPHATE 10 MG/ML IJ SOLN
INTRAMUSCULAR | Status: AC
  Filled 2013-11-09: qty 1

## 2013-11-09 MED ORDER — PROPOFOL 10 MG/ML IV BOLUS
INTRAVENOUS | Status: AC
  Filled 2013-11-09: qty 20

## 2013-11-09 MED ORDER — MORPHINE SULFATE (PF) 1 MG/ML IV SOLN
INTRAVENOUS | Status: DC
  Administered 2013-11-09: 1.5 mg via INTRAVENOUS
  Administered 2013-11-09: 14:00:00 via INTRAVENOUS
  Administered 2013-11-09 (×2): 1.5 mg via INTRAVENOUS
  Administered 2013-11-10: 4.5 mg via INTRAVENOUS
  Administered 2013-11-10: 1.5 mg via INTRAVENOUS
  Administered 2013-11-10: 0.3 mg via INTRAVENOUS
  Administered 2013-11-10: 1.5 mg via INTRAVENOUS
  Administered 2013-11-10: 3 mg via INTRAVENOUS
  Administered 2013-11-10 – 2013-11-11 (×2): 1.5 mg via INTRAVENOUS
  Administered 2013-11-11: 12:00:00 via INTRAVENOUS
  Administered 2013-11-11: 0.3 mg via INTRAVENOUS
  Administered 2013-11-11: 1.5 mg via INTRAVENOUS
  Filled 2013-11-09: qty 25

## 2013-11-09 MED ORDER — DEXTROSE 5 % IV SOLN
2.0000 g | Freq: Two times a day (BID) | INTRAVENOUS | Status: AC
  Administered 2013-11-09: 2 g via INTRAVENOUS
  Filled 2013-11-09: qty 2

## 2013-11-09 MED ORDER — GLYCOPYRROLATE 0.2 MG/ML IJ SOLN
INTRAMUSCULAR | Status: DC | PRN
  Administered 2013-11-09: .6 mg via INTRAVENOUS

## 2013-11-09 MED ORDER — SODIUM CHLORIDE 0.9 % IV SOLN
2.0000 g | INTRAVENOUS | Status: DC | PRN
  Administered 2013-11-09: 2 g via INTRAVENOUS

## 2013-11-09 SURGICAL SUPPLY — 76 items
APPLIER CLIP 5 13 M/L LIGAMAX5 (MISCELLANEOUS)
APPLIER CLIP ROT 10 11.4 M/L (STAPLE)
APR CLP MED LRG 11.4X10 (STAPLE)
APR CLP MED LRG 5 ANG JAW (MISCELLANEOUS)
BLADE EXTENDED COATED 6.5IN (ELECTRODE) ×1 IMPLANT
BLADE HEX COATED 2.75 (ELECTRODE) ×4 IMPLANT
BLADE SURG SZ10 CARB STEEL (BLADE) ×2 IMPLANT
CABLE HIGH FREQUENCY MONO STRZ (ELECTRODE) ×2 IMPLANT
CANISTER SUCTION 2500CC (MISCELLANEOUS) ×1 IMPLANT
CELLS DAT CNTRL 66122 CELL SVR (MISCELLANEOUS) ×1 IMPLANT
CLIP APPLIE 5 13 M/L LIGAMAX5 (MISCELLANEOUS) IMPLANT
CLIP APPLIE ROT 10 11.4 M/L (STAPLE) IMPLANT
COUNTER NEEDLE 20 DBL MAG RED (NEEDLE) ×2 IMPLANT
COVER MAYO STAND STRL (DRAPES) ×4 IMPLANT
DECANTER SPIKE VIAL GLASS SM (MISCELLANEOUS) ×2 IMPLANT
DISSECTOR BLUNT TIP ENDO 5MM (MISCELLANEOUS) IMPLANT
DRAIN CHANNEL 19F RND (DRAIN) IMPLANT
DRAPE LAPAROSCOPIC ABDOMINAL (DRAPES) ×2 IMPLANT
DRAPE LG THREE QUARTER DISP (DRAPES) ×2 IMPLANT
DRAPE UTILITY XL STRL (DRAPES) ×4 IMPLANT
DRAPE WARM FLUID 44X44 (DRAPE) ×2 IMPLANT
DRSG OPSITE POSTOP 4X10 (GAUZE/BANDAGES/DRESSINGS) IMPLANT
DRSG OPSITE POSTOP 4X6 (GAUZE/BANDAGES/DRESSINGS) IMPLANT
DRSG OPSITE POSTOP 4X8 (GAUZE/BANDAGES/DRESSINGS) ×1 IMPLANT
ELECT REM PT RETURN 9FT ADLT (ELECTROSURGICAL) ×2
ELECTRODE REM PT RTRN 9FT ADLT (ELECTROSURGICAL) ×1 IMPLANT
EVACUATOR SILICONE 100CC (DRAIN) IMPLANT
FILTER SMOKE EVAC LAPAROSHD (FILTER) ×1 IMPLANT
GLOVE ECLIPSE 8.0 STRL XLNG CF (GLOVE) ×4 IMPLANT
GLOVE INDICATOR 8.0 STRL GRN (GLOVE) ×4 IMPLANT
GOWN STRL REUS W/TWL XL LVL3 (GOWN DISPOSABLE) ×8 IMPLANT
KIT BASIN OR (CUSTOM PROCEDURE TRAY) ×2 IMPLANT
LEGGING LITHOTOMY PAIR STRL (DRAPES) ×2 IMPLANT
LIGASURE IMPACT 36 18CM CVD LR (INSTRUMENTS) ×1 IMPLANT
PENCIL BUTTON HOLSTER BLD 10FT (ELECTRODE) ×5 IMPLANT
RELOAD PROXIMATE 75MM BLUE (ENDOMECHANICALS) ×4 IMPLANT
RELOAD STAPLE 75 3.8 BLU REG (ENDOMECHANICALS) IMPLANT
RETRACTOR WND ALEXIS 18 MED (MISCELLANEOUS) IMPLANT
RTRCTR WOUND ALEXIS 18CM MED (MISCELLANEOUS) ×2
SCALPEL HARMONIC ACE (MISCELLANEOUS) ×1 IMPLANT
SCISSORS LAP 5X35 DISP (ENDOMECHANICALS) ×2 IMPLANT
SET IRRIG TUBING LAPAROSCOPIC (IRRIGATION / IRRIGATOR) ×1 IMPLANT
SLEEVE XCEL OPT CAN 5 100 (ENDOMECHANICALS) ×5 IMPLANT
SOLUTION ANTI FOG 6CC (MISCELLANEOUS) ×2 IMPLANT
SPONGE GAUZE 4X4 12PLY (GAUZE/BANDAGES/DRESSINGS) ×1 IMPLANT
SPONGE LAP 18X18 X RAY DECT (DISPOSABLE) ×2 IMPLANT
STAPLER PROXIMATE 75MM BLUE (STAPLE) ×1 IMPLANT
STAPLER VISISTAT 35W (STAPLE) ×2 IMPLANT
SUCTION POOLE TIP (SUCTIONS) ×3 IMPLANT
SUT ETHILON 2 0 PS N (SUTURE) IMPLANT
SUT PDS AB 1 CTX 36 (SUTURE) IMPLANT
SUT PDS AB 1 TP1 96 (SUTURE) ×2 IMPLANT
SUT PROLENE 2 0 KS (SUTURE) IMPLANT
SUT PROLENE 2 0 SH DA (SUTURE) IMPLANT
SUT SILK 2 0 (SUTURE) ×2
SUT SILK 2 0 SH CR/8 (SUTURE) ×2 IMPLANT
SUT SILK 2-0 18XBRD TIE 12 (SUTURE) ×1 IMPLANT
SUT SILK 3 0 (SUTURE) ×2
SUT SILK 3 0 SH CR/8 (SUTURE) ×3 IMPLANT
SUT SILK 3-0 18XBRD TIE 12 (SUTURE) ×1 IMPLANT
SUT VIC AB 3-0 SH 27 (SUTURE) ×4
SUT VIC AB 3-0 SH 27XBRD (SUTURE) IMPLANT
SUT VICRYL 2 0 18  UND BR (SUTURE) ×1
SUT VICRYL 2 0 18 UND BR (SUTURE) ×1 IMPLANT
SYR BULB IRRIGATION 50ML (SYRINGE) ×2 IMPLANT
SYS LAPSCP GELPORT 120MM (MISCELLANEOUS) ×2
SYSTEM LAPSCP GELPORT 120MM (MISCELLANEOUS) IMPLANT
TOWEL OR 17X26 10 PK STRL BLUE (TOWEL DISPOSABLE) ×4 IMPLANT
TOWEL OR NON WOVEN STRL DISP B (DISPOSABLE) ×4 IMPLANT
TRAY FOLEY CATH 14FRSI W/METER (CATHETERS) ×2 IMPLANT
TRAY LAP CHOLE (CUSTOM PROCEDURE TRAY) ×2 IMPLANT
TROCAR BLADELESS OPT 5 100 (ENDOMECHANICALS) ×2 IMPLANT
TROCAR XCEL BLUNT TIP 100MML (ENDOMECHANICALS) IMPLANT
TROCAR XCEL NON-BLD 11X100MML (ENDOMECHANICALS) IMPLANT
TUBING INSUFFLATION 10FT LAP (TUBING) ×2 IMPLANT
YANKAUER SUCT BULB TIP 10FT TU (MISCELLANEOUS) ×4 IMPLANT

## 2013-11-09 NOTE — Anesthesia Postprocedure Evaluation (Signed)
  Anesthesia Post-op Note  Patient: Jay Pace  Procedure(s) Performed: Procedure(s) (LRB): LAPAROSCOPIC ASSISTED PARTIAL COLECTOMY (N/A)  Patient Location: PACU  Anesthesia Type: General  Level of Consciousness: awake and alert   Airway and Oxygen Therapy: Patient Spontanous Breathing  Post-op Pain: mild  Post-op Assessment: Post-op Vital signs reviewed, Patient's Cardiovascular Status Stable, Respiratory Function Stable, Patent Airway and No signs of Nausea or vomiting  Last Vitals:  Filed Vitals:   11/09/13 1415  BP: 148/59  Pulse: 72  Temp: 36.6 C  Resp: 19    Post-op Vital Signs: stable   Complications: No apparent anesthesia complications

## 2013-11-09 NOTE — Anesthesia Procedure Notes (Signed)
Procedure Name: Intubation Date/Time: 11/09/2013 9:59 AM Performed by: Ofilia Neas Pre-anesthesia Checklist: Patient identified, Patient being monitored, Timeout performed, Emergency Drugs available and Suction available Patient Re-evaluated:Patient Re-evaluated prior to inductionOxygen Delivery Method: Circle system utilized Preoxygenation: Pre-oxygenation with 100% oxygen Intubation Type: IV induction and Cricoid Pressure applied Ventilation: Mask ventilation without difficulty Laryngoscope Size: Mac and 4 Grade View: Grade II Tube type: Oral Tube size: 7.5 mm Number of attempts: 1 Airway Equipment and Method: Stylet Placement Confirmation: ETT inserted through vocal cords under direct vision,  positive ETCO2 and breath sounds checked- equal and bilateral (anterior, cords vis. with head lift/cricoid press) Secured at: 21 cm Tube secured with: Tape Dental Injury: Teeth and Oropharynx as per pre-operative assessment  Difficulty Due To: Difficulty was anticipated Future Recommendations: Recommend- induction with short-acting agent, and alternative techniques readily available

## 2013-11-09 NOTE — Transfer of Care (Signed)
Immediate Anesthesia Transfer of Care Note  Patient: Jay Pace  Procedure(s) Performed: Procedure(s): LAPAROSCOPIC ASSISTED PARTIAL COLECTOMY (N/A)  Patient Location: PACU  Anesthesia Type:General  Level of Consciousness: awake, alert , oriented and patient cooperative  Airway & Oxygen Therapy: Patient Spontanous Breathing and Patient connected to face mask oxygen  Post-op Assessment: Report given to PACU RN, Post -op Vital signs reviewed and stable and Patient moving all extremities  Post vital signs: Reviewed and stable  Complications: No apparent anesthesia complications

## 2013-11-09 NOTE — Op Note (Signed)
Operative Note  JAVIONE GUNAWAN male 63 y.o. 11/09/2013  PREOPERATIVE DX:  Left colon cancer  POSTOPERATIVE DX:  Same  PROCEDURE:  Laparoscopic-assisted left colectomy with mobilization of splenic flexure         Surgeon: Odis Hollingshead   Assistants: Armandina Gemma, M.D.  Anesthesia: General endotracheal anesthesia  Indications:   This is a 63 year old male who is known to have Hemoccult positive stool. He was subsequently referred for colonoscopy which demonstrated a large mass in the left colon area near the splenic flexure. The biopsy was positive for invasive adenocarcinoma. CT scan and MRI are negative for metastatic disease. He now presents for the above procedure.    Procedure Detail: He was seen in the holding area. He was brought to the operating room placed supine on the operating table and a general anesthetic was administered. He was placed in the lithotomy position. A Foley catheter was inserted. An oral gastric tube was inserted. The hair on the abdominal wall was clipped. The bowel wall and perineum were sterilely prepped and draped. A timeout was performed.  He was placed in slight reverse Trendelenburg position. A 5 mm incision was made in the left subcostal area. Using a 5 mm Optiview trocar and laparoscope access was gained into the peritoneal cavity. A pneumoperitoneum was created. Inspection of the area underneath the trocar demonstrated no evidence of bleeding or organ injury. A 5 mm trocar was placed in the suprapubic region. A 5 mm trocar was placed inferior to the umbilicus and lower midline. A 5 mm trocar was placed in left upper quadrant.  There were adhesions between omentum and abdominal wall left upper quadrant that were divided sharply and using electrocautery. At the splenic flexure, a tattoo marking was identified. I further mobilized the omentum free from the abdominal wall. When this, I divided the lateral attachments of the left and sigmoid colon  sharply. I used blunt dissection to medialize his colon and mobilize it. I then mobilized the transverse colon free from the omentum beginning at the distal half and extending toward the splenic flexure. The splenic flexure was then mobilized using blunt and sharp dissection. At this point, descending and sigmoid colon were very mobile. The supra-and infraumbilical trocars were removed. I made a skin incision connecting both of these. I then divided the subcutaneous tissue and fascia between these 2 trocar incisions entering the peritoneal cavity. A wound protection device was placed.  I tried to bring the distal half of the transverse colon and the left colon as well as sigmoid colon into the wound. There appeared to be a tethering point near the splenic flexure. I inserted a hand assist device and using using hand assist I was able to mobilize this using harmonic scalpel. Following this, I was able to exteriorize the lesion as well as transverse colon proximal to it and left colon and sigmoid colon distal to it. I divided the left colon at its junction with the sigmoid colon with the linear cutting stapler. I divided the transverse colon at its midpoint with the linear cutting stapler. A wedge resection of mesentery was performed using the LigaSure. The left colic artery was also ligated with silk tie. The distal aspect of the specimen was marked. The tumor was palpable. The left colon was passed off the field.  A side-to-side anastomosis was then performed between the sigmoid colon and transverse colon using a linear cutting stapler. No bleeding was noted from the staple line. A crotch stitch of 3-0  silk was placed. The common defect was closed in 2 layers. The first layer was a running full-thickness 3-0 Vicryl locking suture. The second layer was interrupted 3-0 silk sutures in a Lembert fashion. The anastomosis was patent, viable, and under no tension. It was dropped back into the abdominal cavity.  The  abdominal cavity was copiously irrigated with saline solution. There is no evidence of bleeding or organ injury. The fascia a limited midline incision was closed with a running double looped #1 PDS suture. Laparoscopy was performed at this time. A 4 quadrant inspection demonstrated no evidence of organ injury or bleeding. The fascial closure was solid. The pneumoperitoneum was released and the remaining trocars removed.  All skin incisions were closed with staples followed by sterile dressings. He tolerated the procedure well without any apparent complications and was taken to the recovery in satisfactory condition.   Estimated Blood Loss:  400 ml         Drains: none  Blood Given: none          Specimens: Left colon        Complications:  * No complications entered in OR log *         Disposition: PACU - hemodynamically stable.         Condition: stable

## 2013-11-09 NOTE — H&P (View-Only) (Signed)
Patient ID: Jay Pace, male   DOB: 06/07/1951, 63 y.o.   MRN: [id number]  Chief Complaint  Patient presents with  . New Evaluation    eval adeno carcinoma / colon resection?    HPI Jay Pace is a 63 y.o. male.   HPI  He is referred by Dr. Brodie for further evaluation and treatment of a new diagnosed left colon cancer.  He was noted to have Hemoccult positive stool on the Hemoccult cards given to him and his annual physical exam by Dr. Harris. He went and had a colonoscopy by Dr. Brodie.  This demonstrated a large polypoid mass in the left colon area near the splenic flexure. 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 A tattoo was placed around the area as well as a clip. A CT scan was performed. This demonstrated a small kidney cyst. There is a large prostate gland. There were small lesions in the liver that could not be characterized. It was recommended that if the biopsy was positive for malignancy an MRI be performed to better characterize these lesions. He has been feeling well and eating well. No decrease in energy. No change in bowel habits.  There is no family history of colon cancer. He is here with his wife.  Past Medical History  Diagnosis Date  . Internal hemorrhoids   . Hypercholesterolemia   . Allergic rhinitis   . Keratoconus     Past Surgical History  Procedure Laterality Date  . Knee arthroscopy Right     Family History  Problem Relation Age of Onset  . Stroke Father   . Bladder Cancer Father   . Cancer Father     bladder  . Breast cancer Sister   . Cancer Sister     breast and ovarian  . Hypertension Brother   . Stomach cancer Maternal Grandmother   . Liver cancer Brother   . Ovarian cancer Sister   . Cancer Sister     breast  . Breast cancer Sister     x 2    Social History History  Substance Use Topics  . Smoking status: Never Smoker   . Smokeless tobacco: Never Used  . Alcohol Use: No     Comment: rare    No Known  Allergies  No current outpatient prescriptions on file.   No current facility-administered medications for this visit.    Review of Systems Review of Systems  Constitutional: Negative.   HENT: Positive for congestion.   Respiratory: Negative.   Cardiovascular: Negative.   Gastrointestinal: Positive for blood in stool. Negative for abdominal pain.  Genitourinary:       Nocturia twice a night. Slight elevation in PSA. Dr. Harris recommended he see a urologist but he did not want to do that at the time.  Musculoskeletal: Negative.   Skin: Negative.   Neurological: Negative.   Hematological: Negative.     Blood pressure 148/80, pulse 56, temperature 98.2 F (36.8 C), temperature source Temporal, resp. rate 14, height 6' (1.829 m), weight 213 lb 12.8 oz (96.979 kg).  Physical Exam Physical Exam  Constitutional: He appears well-developed and well-nourished. No distress.  HENT:  Head: Normocephalic and atraumatic.  Eyes: No scleral icterus.  Cardiovascular: Normal rate and regular rhythm.   Pulmonary/Chest: Effort normal and breath sounds normal.  Abdominal: Soft. Bowel sounds are normal. He exhibits no mass. There is no tenderness.  Musculoskeletal: He exhibits no edema.  Lymphadenopathy:    He has no cervical adenopathy.    Neurological: He is alert.  Skin: Skin is warm and dry.  Psychiatric: He has a normal mood and affect. His behavior is normal.    Data Reviewed Colonoscopy report. Note from Dr. Olevia Perches. CT scan and CT report.  Assessment    Newly diagnosed left colon cancer. Indeterminate liver lesions on CT scan. Symptoms of BPH as well as enlarged prostate and slightly elevated PSA.     Plan    I recommended an MRI to further characterize the liver lesions. If one of them looked suspicious, may need to get an image guided biopsy. I also recommended that he re-consider seeing a urologist.   As for the newly diagnosed colon cancer, I recommend laparoscopic partial  colectomy.  I have explained the procedure and risks of colon resection.  Risks include but are not limited to bleeding, infection, wound problems, anesthesia, anastomotic leak, need for colostomy, injury to intraabominal organs (such as intestine, spleen, kidney, bladder, ureter, etc.), ileus, irregular bowel habits.  He seems to understand and agrees with the plan.  Will await the results of the MRI.       Avier Jech J 10/20/2013, 12:44 PM

## 2013-11-09 NOTE — Preoperative (Signed)
Beta Blockers   Reason not to administer Beta Blockers:Not Applicable 

## 2013-11-09 NOTE — Anesthesia Preprocedure Evaluation (Addendum)
Anesthesia Evaluation  Patient identified by MRN, date of birth, ID band Patient awake    Reviewed: Allergy & Precautions, H&P , NPO status , Patient's Chart, lab work & pertinent test results  Airway Mallampati: II  TM Distance: >3 FB Neck ROM: full    Dental no notable dental hx. (+) Teeth Intact, Dental Advisory Given   Pulmonary neg pulmonary ROS,  breath sounds clear to auscultation  Pulmonary exam normal       Cardiovascular Exercise Tolerance: Good negative cardio ROS  Rhythm:regular Rate:Normal     Neuro/Psych negative neurological ROS  negative psych ROS   GI/Hepatic negative GI ROS, Neg liver ROS,   Endo/Other  negative endocrine ROS  Renal/GU negative Renal ROS  negative genitourinary   Musculoskeletal   Abdominal   Peds  Hematology negative hematology ROS (+)   Anesthesia Other Findings   Reproductive/Obstetrics negative OB ROS                            Anesthesia Physical Anesthesia Plan  ASA: I  Anesthesia Plan: General   Post-op Pain Management:    Induction: Intravenous  Airway Management Planned: Oral ETT  Additional Equipment:   Intra-op Plan:   Post-operative Plan: Extubation in OR  Informed Consent: I have reviewed the patients History and Physical, chart, labs and discussed the procedure including the risks, benefits and alternatives for the proposed anesthesia with the patient or authorized representative who has indicated his/her understanding and acceptance.   Dental Advisory Given  Plan Discussed with: CRNA and Surgeon  Anesthesia Plan Comments:         Anesthesia Quick Evaluation  

## 2013-11-09 NOTE — Interval H&P Note (Signed)
History and Physical Interval Note:  11/09/2013 9:14 AM  Jay Pace  has presented today for surgery, with the diagnosis of colon cancer  The various methods of treatment have been discussed with the patient and family. After consideration of risks, benefits and other options for treatment, the patient has consented to  Procedure(s): LAPAROSCOPIC ASSISTED PARTIAL COLECTOMY (N/A) as a surgical intervention .  The patient's history has been reviewed, patient examined, no change in status, stable for surgery.  I have reviewed the patient's chart and labs.  Questions were answered to the patient's satisfaction.     [name] [name]

## 2013-11-10 ENCOUNTER — Encounter (HOSPITAL_COMMUNITY): Payer: Self-pay | Admitting: General Surgery

## 2013-11-10 LAB — CBC
HCT: 38.1 % — ABNORMAL LOW (ref 39.0–52.0)
Hemoglobin: 12.9 g/dL — ABNORMAL LOW (ref 13.0–17.0)
MCH: 31.8 pg (ref 26.0–34.0)
MCHC: 33.9 g/dL (ref 30.0–36.0)
MCV: 93.8 fL (ref 78.0–100.0)
Platelets: 195 10*3/uL (ref 150–400)
RBC: 4.06 MIL/uL — ABNORMAL LOW (ref 4.22–5.81)
RDW: 12.7 % (ref 11.5–15.5)
WBC: 17.8 10*3/uL — ABNORMAL HIGH (ref 4.0–10.5)

## 2013-11-10 LAB — BASIC METABOLIC PANEL
BUN: 10 mg/dL (ref 6–23)
CO2: 23 mEq/L (ref 19–32)
Calcium: 8.9 mg/dL (ref 8.4–10.5)
Chloride: 103 mEq/L (ref 96–112)
Creatinine, Ser: 0.72 mg/dL (ref 0.50–1.35)
GFR calc Af Amer: >90 mL/min (ref >90)
GFR calc non Af Amer: >90 mL/min (ref >90)
Glucose, Bld: 158 mg/dL — ABNORMAL HIGH (ref 70–99)
Potassium: 4.5 mEq/L (ref 3.7–5.3)
Sodium: 137 mEq/L (ref 137–147)

## 2013-11-10 NOTE — Progress Notes (Signed)
INITIAL NUTRITION ASSESSMENT  DOCUMENTATION CODES Per approved criteria  -Not Applicable   INTERVENTION: - Diet advancement per MD - Will continue to monitor   NUTRITION DIAGNOSIS: Inadequate oral intake related to clear liquid diet as evidenced by diet order.    Goal: Advance diet as tolerated to regular diet  Monitor:  Weights, labs, diet advancement  Reason for Assessment: Malnutrition screening tool   63 y.o. male  Admitting Dx: Colon cancer  ASSESSMENT: Pt with colon CA, admitted for partial colectomy which was completed yesterday. Met with pt who reports excellent appetite PTA and some unintended weight gain since the winter r/t being unable to work out as much due to the cold weather. Reports tolerating clear liquid diet without nausea.   Height: Ht Readings from Last 1 Encounters:  11/09/13 6' (1.829 m)    Weight: Wt Readings from Last 1 Encounters:  11/09/13 212 lb (96.163 kg)    Ideal Body Weight: 178 lb   % Ideal Body Weight: 119%  Wt Readings from Last 10 Encounters:  11/09/13 212 lb (96.163 kg)  11/09/13 212 lb (96.163 kg)  11/03/13 212 lb (96.163 kg)  10/20/13 213 lb 12.8 oz (96.979 kg)  10/05/13 208 lb (94.348 kg)  08/11/13 208 lb (94.348 kg)    Usual Body Weight: 208 lb   % Usual Body Weight: 102%  BMI:  Body mass index is 28.75 kg/(m^2).  Estimated Nutritional Needs: Kcal: 2050-2250 Protein: 115-130g Fluid: 2-2.2L/day   Skin: Abdominal incision   Diet Order: Clear Liquid  EDUCATION NEEDS: -No education needs identified at this time   Intake/Output Summary (Last 24 hours) at 11/10/13 1202 Last data filed at 11/10/13 1000  Gross per 24 hour  Intake 3344.42 ml  Output   2700 ml  Net 644.42 ml    Last BM: 2/17  Labs:   Recent Labs Lab 11/10/13 0459  NA 137  K 4.5  CL 103  CO2 23  BUN 10  CREATININE 0.72  CALCIUM 8.9  GLUCOSE 158*    CBG (last 3)  No results found for this basename: GLUCAP,  in the last 72  hours  Scheduled Meds: . alvimopan  12 mg Oral BID  . heparin subcutaneous  5,000 Units Subcutaneous 3 times per day  . morphine   Intravenous 6 times per day  . pantoprazole (PROTONIX) IV  40 mg Intravenous Q24H  . tamsulosin  0.4 mg Oral QPC supper    Continuous Infusions: . dextrose 5% lactated ringers with KCl 20 mEq/L 100 mL/hr at 11/10/13 0354    Past Medical History  Diagnosis Date  . Internal hemorrhoids   . Hypercholesterolemia   . Allergic rhinitis   . Keratoconus     Past Surgical History  Procedure Laterality Date  . Knee arthroscopy Right     Mikey College MS, RD, LDN [phone] Pager [phone] After Hours Pager

## 2013-11-10 NOTE — Care Management Note (Signed)
    Page 1 of 1   11/10/2013     9:13:33 AM   CARE MANAGEMENT NOTE 11/10/2013  Patient:  SHIRLEY, BOLLE   Account Number:  [id number]  Date Initiated:  11/10/2013  Documentation initiated by:  Sunday Spillers  Subjective/Objective Assessment:   63 yo male admitted s/p lap colectomy. PTA lived at home with spouse.     Action/Plan:   Home when stable   Anticipated DC Date:  11/14/2013   Anticipated DC Plan:  [place]  CM consult      Choice offered to / List presented to:             Status of service:  Completed, signed off Medicare Important Message given?   (If response is "NO", the following Medicare IM given date fields will be blank) Date Medicare IM given:   Date Additional Medicare IM given:    Discharge Disposition:  HOME/SELF CARE  Per UR Regulation:  Reviewed for med. necessity/level of care/duration of stay  If discussed at [place] of Stay Meetings, dates discussed:    Comments:

## 2013-11-10 NOTE — Progress Notes (Signed)
1 Day Post-Op  Subjective: Pain adequately controlled.  No nausea.  Has been OOB.  Objective: Vital signs in last 24 hours: Temp:  [97.4 F (36.3 C)-98.1 F (36.7 C)] 97.8 F (36.6 C) (02/18 0622) Pulse Rate:  [67-80] 69 (02/18 0622) Resp:  [8-23] 20 (02/18 0724) BP: (141-163)/(59-79) 141/72 mmHg (02/18 0622) SpO2:  [97 %-100 %] 98 % (02/18 0724) Weight:  [212 lb (96.163 kg)] 212 lb (96.163 kg) (02/17 1451) Last BM Date: 11/09/13  Intake/Output from previous day: 02/17 0701 - 02/18 0700 In: 4552.8 [I.V.:4502.8; IV Piggyback:50] Out: 3050 [Urine:2625; Blood:425] Intake/Output this shift:    PE: General- In NAD Abdomen-soft, some bloody drainage on extraction site dressings, few bowel sounds  Lab Results:   Recent Labs  11/10/13 0459  WBC 17.8*  HGB 12.9*  HCT 38.1*  PLT 195   BMET  Recent Labs  11/10/13 0459  NA 137  K 4.5  CL 103  CO2 23  GLUCOSE 158*  BUN 10  CREATININE 0.72  CALCIUM 8.9   PT/INR No results found for this basename: LABPROT, INR,  in the last 72 hours Comprehensive Metabolic Panel:    Component Value Date/Time   NA 137 11/10/2013 0459   NA 141 11/03/2013 0920   K 4.5 11/10/2013 0459   K 4.1 11/03/2013 0920   CL 103 11/10/2013 0459   CL 104 11/03/2013 0920   CO2 23 11/10/2013 0459   CO2 27 11/03/2013 0920   BUN 10 11/10/2013 0459   BUN 16 11/03/2013 0920   CREATININE 0.72 11/10/2013 0459   CREATININE 0.80 11/03/2013 0920   GLUCOSE 158* 11/10/2013 0459   GLUCOSE 97 11/03/2013 0920   CALCIUM 8.9 11/10/2013 0459   CALCIUM 9.4 11/03/2013 0920   AST 16 11/03/2013 0920   AST 16 10/05/2013 1648   ALT 19 11/03/2013 0920   ALT 21 10/05/2013 1648   ALKPHOS 68 11/03/2013 0920   ALKPHOS 65 10/05/2013 1648   BILITOT 0.5 11/03/2013 0920   BILITOT 1.0 10/05/2013 1648   PROT 7.5 11/03/2013 0920   PROT 7.0 10/05/2013 1648   ALBUMIN 4.1 11/03/2013 0920   ALBUMIN 4.0 10/05/2013 1648     Studies/Results: No results found.  Anti-infectives: Anti-infectives    Start     Dose/Rate Route Frequency Ordered Stop   11/09/13 2200  cefoTEtan (CEFOTAN) 2 g in dextrose 5 % 50 mL IVPB     2 g 100 mL/hr over 30 Minutes Intravenous Every 12 hours 11/09/13 1439 11/09/13 2151   11/09/13 0000  cefoTEtan (CEFOTAN) 2 g in dextrose 5 % 50 mL IVPB  Status:  Discontinued     2 g 100 mL/hr over 30 Minutes Intravenous  Once 11/08/13 1641 11/09/13 1425      Assessment Principal Problem:   Colon cancer s/p lap assisted left colectomy 11/09/13-stable overnight    LOS: 1 day   Plan: Clear liquids.  Mobilize.  Decrease IVF.   Jay Pace 11/10/2013

## 2013-11-11 MED ORDER — MORPHINE SULFATE 2 MG/ML IJ SOLN: 2.0000 mg | INTRAMUSCULAR | Status: DC | PRN

## 2013-11-11 MED ORDER — ZOLPIDEM TARTRATE 5 MG PO TABS
5.0000 mg | ORAL_TABLET | Freq: Every evening | ORAL | Status: DC | PRN
  Administered 2013-11-12 – 2013-11-13 (×2): 5 mg via ORAL
  Filled 2013-11-11 (×3): qty 1

## 2013-11-11 MED ORDER — OXYCODONE HCL 5 MG PO TABS: 5.0000 mg | ORAL_TABLET | ORAL | Status: DC | PRN

## 2013-11-11 NOTE — Progress Notes (Signed)
2 Days Post-Op  Subjective: Tolerating clear liquid diet.  No flatus or BM.  No nausea.  Objective: Vital signs in last 24 hours: Temp:  [97.6 F (36.4 C)-98.6 F (37 C)] 97.9 F (36.6 C) (02/19 0635) Pulse Rate:  [62-95] 65 (02/19 0635) Resp:  [18-22] 19 (02/19 0736) BP: (137-172)/(70-74) 143/71 mmHg (02/19 0635) SpO2:  [96 %-100 %] 100 % (02/19 0736) Last BM Date: 11/09/13  Intake/Output from previous day: 02/18 0701 - 02/19 0700 In: 1991.7 [P.O.:1440; I.V.:551.7] Out: 2950 [Urine:2950] Intake/Output this shift:    PE: General- In NAD Abdomen-soft, dressings unchanged, occasional bowel sound  Lab Results:   Recent Labs  11/10/13 0459  WBC 17.8*  HGB 12.9*  HCT 38.1*  PLT 195   BMET  Recent Labs  11/10/13 0459  NA 137  K 4.5  CL 103  CO2 23  GLUCOSE 158*  BUN 10  CREATININE 0.72  CALCIUM 8.9   PT/INR No results found for this basename: LABPROT, INR,  in the last 72 hours Comprehensive Metabolic Panel:    Component Value Date/Time   NA 137 11/10/2013 0459   NA 141 11/03/2013 0920   K 4.5 11/10/2013 0459   K 4.1 11/03/2013 0920   CL 103 11/10/2013 0459   CL 104 11/03/2013 0920   CO2 23 11/10/2013 0459   CO2 27 11/03/2013 0920   BUN 10 11/10/2013 0459   BUN 16 11/03/2013 0920   CREATININE 0.72 11/10/2013 0459   CREATININE 0.80 11/03/2013 0920   GLUCOSE 158* 11/10/2013 0459   GLUCOSE 97 11/03/2013 0920   CALCIUM 8.9 11/10/2013 0459   CALCIUM 9.4 11/03/2013 0920   AST 16 11/03/2013 0920   AST 16 10/05/2013 1648   ALT 19 11/03/2013 0920   ALT 21 10/05/2013 1648   ALKPHOS 68 11/03/2013 0920   ALKPHOS 65 10/05/2013 1648   BILITOT 0.5 11/03/2013 0920   BILITOT 1.0 10/05/2013 1648   PROT 7.5 11/03/2013 0920   PROT 7.0 10/05/2013 1648   ALBUMIN 4.1 11/03/2013 0920   ALBUMIN 4.0 10/05/2013 1648     Studies/Results: No results found.  Anti-infectives: Anti-infectives   Start     Dose/Rate Route Frequency Ordered Stop   11/09/13 2200  cefoTEtan (CEFOTAN) 2 g in  dextrose 5 % 50 mL IVPB     2 g 100 mL/hr over 30 Minutes Intravenous Every 12 hours 11/09/13 1439 11/09/13 2151   11/09/13 0000  cefoTEtan (CEFOTAN) 2 g in dextrose 5 % 50 mL IVPB  Status:  Discontinued     2 g 100 mL/hr over 30 Minutes Intravenous  Once 11/08/13 1641 11/09/13 1425      Assessment Principal Problem:   Colon cancer s/p lap assisted left colectomy 11/09/13-some ileus; pathology pending    LOS: 2 days   Plan: Advance to full liquids.   Jay Pace J 11/11/2013

## 2013-11-12 MED ORDER — OXYCODONE HCL 5 MG PO TABS: 5.0000 mg | ORAL_TABLET | ORAL | Status: DC | PRN

## 2013-11-12 NOTE — Progress Notes (Signed)
3 Days Post-Op  Subjective: Tolerated full liquids.  No flatus or BM.  Voiding a lot.  Objective: Vital signs in last 24 hours: Temp:  [97.9 F (36.6 C)-98.9 F (37.2 C)] 98.9 F (37.2 C) (02/20 0604) Pulse Rate:  [68-73] 68 (02/20 0604) Resp:  [18-20] 18 (02/20 0604) BP: (128-160)/(59-72) 128/59 mmHg (02/20 0604) SpO2:  [97 %-100 %] 97 % (02/20 0604) Last BM Date: 11/09/13  Intake/Output from previous day: 02/19 0701 - 02/20 0700 In: 4134.2 [P.O.:600; I.V.:3534.2] Out: 1100 [Urine:1100] Intake/Output this shift:    PE: General- In NAD Abdomen-soft, incisions are all clean and intact, hypoactive bowel sounds  Lab Results:   Recent Labs  11/10/13 0459  WBC 17.8*  HGB 12.9*  HCT 38.1*  PLT 195   BMET  Recent Labs  11/10/13 0459  NA 137  K 4.5  CL 103  CO2 23  GLUCOSE 158*  BUN 10  CREATININE 0.72  CALCIUM 8.9   PT/INR No results found for this basename: LABPROT, INR,  in the last 72 hours Comprehensive Metabolic Panel:    Component Value Date/Time   NA 137 11/10/2013 0459   NA 141 11/03/2013 0920   K 4.5 11/10/2013 0459   K 4.1 11/03/2013 0920   CL 103 11/10/2013 0459   CL 104 11/03/2013 0920   CO2 23 11/10/2013 0459   CO2 27 11/03/2013 0920   BUN 10 11/10/2013 0459   BUN 16 11/03/2013 0920   CREATININE 0.72 11/10/2013 0459   CREATININE 0.80 11/03/2013 0920   GLUCOSE 158* 11/10/2013 0459   GLUCOSE 97 11/03/2013 0920   CALCIUM 8.9 11/10/2013 0459   CALCIUM 9.4 11/03/2013 0920   AST 16 11/03/2013 0920   AST 16 10/05/2013 1648   ALT 19 11/03/2013 0920   ALT 21 10/05/2013 1648   ALKPHOS 68 11/03/2013 0920   ALKPHOS 65 10/05/2013 1648   BILITOT 0.5 11/03/2013 0920   BILITOT 1.0 10/05/2013 1648   PROT 7.5 11/03/2013 0920   PROT 7.0 10/05/2013 1648   ALBUMIN 4.1 11/03/2013 0920   ALBUMIN 4.0 10/05/2013 1648     Studies/Results: Pathology:  T1, N0 (0/13), M0  Anti-infectives: Anti-infectives   Start     Dose/Rate Route Frequency Ordered Stop   11/09/13 2200   cefoTEtan (CEFOTAN) 2 g in dextrose 5 % 50 mL IVPB     2 g 100 mL/hr over 30 Minutes Intravenous Every 12 hours 11/09/13 1439 11/09/13 2151   11/09/13 0000  cefoTEtan (CEFOTAN) 2 g in dextrose 5 % 50 mL IVPB  Status:  Discontinued     2 g 100 mL/hr over 30 Minutes Intravenous  Once 11/08/13 1641 11/09/13 1425      Assessment Principal Problem:   Colon cancer s/p lap assisted left colectomy 11/09/13-still with some ileus; pathology consistent with stage 1 colon cancer    LOS: 3 days   Plan: Decrease IVF.  Advance to bland diet.  Odis Hollingshead 11/12/2013

## 2013-11-12 NOTE — Discharge Instructions (Signed)
[facility], [place] [phone]  OPEN ABDOMINAL SURGERY: POST OP INSTRUCTIONS  Always review your discharge instruction sheet given to you by the facility where your surgery was performed.  IF YOU HAVE DISABILITY OR FAMILY LEAVE FORMS, YOU MUST BRING THEM TO THE OFFICE FOR PROCESSING.  PLEASE DO NOT GIVE THEM TO YOUR DOCTOR.  1. A prescription for pain medication may be given to you upon discharge.  Take your pain medication as prescribed, if needed.  If narcotic pain medicine is not needed, then you may take acetaminophen (Tylenol) or ibuprofen (Advil) as needed. 2. Take your usually prescribed medications unless otherwise directed. 3. If you need a refill on your pain medication, please contact your pharmacy. They will contact our office to request authorization.  Prescriptions will not be filled after 5pm or on week-ends. 4. You should follow a bland diet after arrival home.   Be sure to include lots of fluids daily. 5. Most patients will experience some swelling and bruising in the area of the incision. Ice pack will help. Swelling and bruising can take several days to resolve..  6. It is common to experience some constipation if taking pain medication after surgery.  Increasing fluid intake and taking a stool softener will usually help or prevent this problem from occurring.  A mild laxative (Milk of Magnesia or Miralax) should be taken according to package directions if there are no bowel movements after 48 hours. 7.  You may have steri-strips (small skin tapes) in place directly over the incision.  These strips should be left on the skin for 7-10 days.  If your surgeon used skin glue on the incision, you may shower in 24 hours.  The glue will flake off over the next 2-3 weeks.  Any sutures or staples will be removed at the office during your follow-up visit. You may find that a light gauze bandage over your incision may keep your staples from being rubbed or pulled. You may  shower and replace the bandage daily. 8. ACTIVITIES:  You may resume regular (light) daily activities beginning the next day--such as daily self-care, walking, climbing stairs--gradually increasing activities as tolerated.  You may have sexual intercourse when it is comfortable.  Refrain from any heavy lifting or straining for 6 weeks.  a. You may drive when you no longer are taking prescription pain medication, you can comfortably wear a seatbelt, and you can safely maneuver your car and apply brakes b. Return to Work: ___________________________________ 80. You should see your doctor in the office for a follow-up appointment approximately two weeks after your surgery.  Make sure that you call for this appointment within a day or two after you arrive home to insure a convenient appointment time. OTHER INSTRUCTIONS:  _____________________________________________________________ _____________________________________________________________  WHEN TO CALL YOUR DOCTOR: 1. Fever over 101.0 2. Inability to urinate 3. Nausea and/or vomiting 4. Extreme swelling or bruising 5. Continued bleeding from incision. 6. Increased pain, redness, or drainage from the incision.  The clinic staff is available to answer your questions during regular business hours.  Please dont hesitate to call and ask to speak to one of the nurses if you have concerns.  For further questions, please visit [email or web address]

## 2013-11-13 MED ORDER — HYDROCODONE-ACETAMINOPHEN 5-325 MG PO TABS: 1.0000 | ORAL_TABLET | Freq: Four times a day (QID) | ORAL | Status: DC | PRN

## 2013-11-13 NOTE — Discharge Summary (Signed)
Physician Discharge Summary  Patient ID: Jay Pace MRN: [id number] DOB/AGE: 63-Jun-1952 63 y.o.  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 - THIRTEEN LYMPH NODES, NEGATIVE FOR NEOPLASM (0/13). - RESECTION MARGINS, NEGATIVE FOR ATYPIA OR MALIGNANCY. Microscopic Comment COLON AND Principal Problem:   Colon cancer s/p lap assisted left colectomy 11/09/13   Surgery:  Lap assisted left hemicolectomy  Discharged Condition: improved  Hospital Course:   Had surgery.  Diet advanced and flatus occurred.  Incisions ok.  Tolerating solids.  Ready for discharge  Consults: none  Significant Diagnostic Studies: none    Discharge Exam: Blood pressure 134/78, pulse 66, temperature 98 F (36.7 C), temperature source Oral, resp. rate 18, height 6' (1.829 m), weight 212 lb (96.163 kg), SpO2 96.00%. Staples out before discharge.  Minimal pain.    Disposition: Final discharge disposition not confirmed  Discharge Orders   Future Appointments Provider Department Dept Phone   11/26/2013 9:10 AM Odis Hollingshead, MD [facility], [place] [phone]   Future Orders Complete By Expires   Diet general  As directed    Discharge instructions  As directed    Comments:     May shower etc   Increase activity slowly  As directed    No wound care  As directed    Comments:     May shower with steri strips in place       Medication List         oxyCODONE 5 MG immediate release tablet  Commonly known as:  Oxy IR/ROXICODONE  Take 1-2 tablets (5-10 mg total) by mouth every 4 (four) hours as needed for severe pain.     tamsulosin 0.4 MG Caps capsule  Commonly known as:  FLOMAX  Take 0.4 mg by mouth daily after supper.           Follow-up Information   Follow up with  ROSENBOWER,TODD J, MD. Call in 3 weeks.   Specialty:  General Surgery   Contact information:   [address] [place] [address] [phone]       Signed: Pedro Earls 11/13/2013, 9:41 AM

## 2013-11-22 ENCOUNTER — Encounter (HOSPITAL_COMMUNITY): Payer: Self-pay

## 2013-11-26 ENCOUNTER — Ambulatory Visit (INDEPENDENT_AMBULATORY_CARE_PROVIDER_SITE_OTHER): Payer: 59 | Admitting: General Surgery

## 2013-11-26 ENCOUNTER — Encounter (INDEPENDENT_AMBULATORY_CARE_PROVIDER_SITE_OTHER): Payer: Self-pay | Admitting: General Surgery

## 2013-11-26 VITALS — BP 128/72 | HR 72 | Temp 97.7°F | Resp 15 | Ht 72.0 in | Wt 204.8 lb

## 2013-11-26 DIAGNOSIS — Z4889 Encounter for other specified surgical aftercare: Secondary | ICD-10-CM

## 2013-11-26 NOTE — Progress Notes (Signed)
Procedure:  Laparoscopic-assisted left colectomy  Date:  11/09/2013  Pathology:  Stage I  History:  He is here for a postoperative visit and is progressing well. His energy level is slowly returning. No problems with eating or bowel movements.  Exam: General- Is in NAD. Abdomen-soft, incisions are clean, intact, and solid.  Assessment:  Stage I colon cancer status post left colectomy-progressing well.  Plan:  Start walking more. Continue lifting restrictions. Return visit 3 weeks. No need for medical oncology consultation.

## 2013-11-26 NOTE — Patient Instructions (Signed)
Walk more. Continue 10 pound weight restriction.

## 2013-12-13 ENCOUNTER — Encounter (INDEPENDENT_AMBULATORY_CARE_PROVIDER_SITE_OTHER): Payer: Self-pay

## 2013-12-27 ENCOUNTER — Ambulatory Visit (INDEPENDENT_AMBULATORY_CARE_PROVIDER_SITE_OTHER): Payer: 59 | Admitting: General Surgery

## 2013-12-27 VITALS — BP 134/80 | HR 76 | Temp 97.8°F | Resp 14 | Ht 72.0 in | Wt 203.8 lb

## 2013-12-27 DIAGNOSIS — Z4889 Encounter for other specified surgical aftercare: Secondary | ICD-10-CM

## 2013-12-27 NOTE — Progress Notes (Signed)
Procedure:  Laparoscopic-assisted left colectomy  Date:  11/09/2013  Pathology:  Stage I  History:  He is here for another postoperative visit and continues to do well.   Exam: General- Is in NAD. Abdomen-soft, incisions are clean, intact, and solid.  Assessment:  Stage I colon cancer status post left colectomy-doing well.  Plan:  Resume normal activities as tolerated.  Return visit in 6 months and will check CEA level at that time.  Colonoscopy one year after surgery.

## 2013-12-27 NOTE — Patient Instructions (Signed)
May resume normal activities as tolerated as discussed.

## 2014-02-22 DIAGNOSIS — N138 Other obstructive and reflux uropathy: Secondary | ICD-10-CM | POA: Insufficient documentation

## 2014-02-22 DIAGNOSIS — N401 Enlarged prostate with lower urinary tract symptoms: Secondary | ICD-10-CM

## 2014-02-22 DIAGNOSIS — R972 Elevated prostate specific antigen [PSA]: Secondary | ICD-10-CM | POA: Insufficient documentation

## 2014-05-16 DIAGNOSIS — N400 Enlarged prostate without lower urinary tract symptoms: Secondary | ICD-10-CM | POA: Insufficient documentation

## 2014-06-29 ENCOUNTER — Other Ambulatory Visit (INDEPENDENT_AMBULATORY_CARE_PROVIDER_SITE_OTHER): Payer: Self-pay | Admitting: General Surgery

## 2014-06-29 ENCOUNTER — Ambulatory Visit (INDEPENDENT_AMBULATORY_CARE_PROVIDER_SITE_OTHER): Payer: 59 | Admitting: General Surgery

## 2014-06-29 DIAGNOSIS — Z85038 Personal history of other malignant neoplasm of large intestine: Secondary | ICD-10-CM

## 2014-06-30 ENCOUNTER — Encounter (INDEPENDENT_AMBULATORY_CARE_PROVIDER_SITE_OTHER): Payer: Self-pay | Admitting: General Surgery

## 2014-06-30 LAB — CEA: CEA: 3.8 ng/mL (ref 0.0–4.7)

## 2014-06-30 NOTE — Progress Notes (Unsigned)
Patient ID: Jay Pace, male   DOB: 18-Nov-1950, 63 y.o.   MRN: [id number] CEA level is 3.8 which is normal. Will inform him.

## 2014-10-07 ENCOUNTER — Encounter: Payer: Self-pay | Admitting: Internal Medicine

## 2014-10-26 ENCOUNTER — Encounter: Payer: Self-pay | Admitting: Internal Medicine

## 2014-11-16 ENCOUNTER — Ambulatory Visit (AMBULATORY_SURGERY_CENTER): Payer: Self-pay | Admitting: *Deleted

## 2014-11-16 VITALS — Ht 72.0 in | Wt 218.0 lb

## 2014-11-16 DIAGNOSIS — Z85038 Personal history of other malignant neoplasm of large intestine: Secondary | ICD-10-CM

## 2014-11-16 MED ORDER — MOVIPREP 100 G PO SOLR: ORAL | Status: DC

## 2014-11-16 NOTE — Progress Notes (Signed)
Patient denies any allergies to eggs or soy. Patient denies any problems with anesthesia/sedation. Patient denies any oxygen use at home and does not take any diet/weight loss medications. EMMI education assisgned to patient on colonoscopy, this was explained and instructions given to patient. 

## 2014-11-30 ENCOUNTER — Ambulatory Visit (AMBULATORY_SURGERY_CENTER): Payer: 59 | Admitting: Internal Medicine

## 2014-11-30 ENCOUNTER — Encounter: Payer: Self-pay | Admitting: Internal Medicine

## 2014-11-30 VITALS — BP 133/66 | HR 55 | Temp 97.2°F | Resp 15 | Ht 72.0 in | Wt 263.0 lb

## 2014-11-30 DIAGNOSIS — Z85038 Personal history of other malignant neoplasm of large intestine: Secondary | ICD-10-CM

## 2014-11-30 MED ORDER — SODIUM CHLORIDE 0.9 % IV SOLN: 500.0000 mL | INTRAVENOUS | Status: DC

## 2014-11-30 NOTE — Progress Notes (Signed)
Waiting on Dr. Maurene Capes

## 2014-11-30 NOTE — Op Note (Addendum)
[place]  [address]. [place], [address]   COLONOSCOPY PROCEDURE REPORT  PATIENT: Jay Pace, Jay Pace  MR#: [id number] BIRTHDATE: 1951/08/13 , 63  yrs. old GENDER: male ENDOSCOPIST: Lafayette Dragon, MD REFERRED OZ:[id number] Kenton Kingfisher, M.D. PROCEDURE DATE:  11/30/2014 PROCEDURE:   Colonoscopy, surveillance First Screening Colonoscopy - Avg.  risk and is 50 yrs.  old or older - No.  Prior Negative Screening - Now for repeat screening. N/A  History of Adenoma - Now for follow-up colonoscopy & has been > or = to 3 yrs.  Yes hx of adenoma.  Has been 3 or more years since last colonoscopy.  Polyps Removed Today? No. ASA CLASS:   Class II INDICATIONS:Surveillance due to prior colonic neoplasia and Average Risk. MEDICATIONS: Monitored anesthesia care and Propofol 250 mg IV  DESCRIPTION OF PROCEDURE:   After the risks benefits and alternatives of the procedure were thoroughly explained, informed consent was obtained.  The digital rectal exam revealed no abnormalities of the rectum.   The LB [id number]  endoscope was introduced through the anus and advanced to the cecum, which was identified by both the appendix and ileocecal valve. No adverse events experienced.   The quality of the prep was good.  (MoviPrep was used)  The instrument was then slowly withdrawn as the colon was fully examined.      COLON FINDINGS: There was evidence of a prior end-to-end colo-colonic surgical anastomosis in the descending colon. . It was widely patent. There was scattered diverticuli proximal to the anastomosis Retroflexed views revealed no abnormalities. The time to cecum = 8.5 Withdrawal time = 3.6   The scope was withdrawn and the procedure completed. COMPLICATIONS: There were no immediate complications.  ENDOSCOPIC IMPRESSION: There was evidence of a prior colo-colonic surgical anastomosis in the descending colon mild descending colon diverticulosis No evidence for  recurrent cancer  RECOMMENDATIONS: Recall colonoscopy in one year High fiber diet  eSigned:  Lafayette Dragon, MD 11/30/2014 11:52 AM   cc: Kavin Leech, MD and Jackolyn Confer, MD   PATIENT NAME:  Terran, Klinke MR#: [id number]

## 2014-11-30 NOTE — Patient Instructions (Signed)
YOU HAD AN ENDOSCOPIC PROCEDURE TODAY AT THE [facility]:   Refer to the procedure report that was given to you for any specific questions about what was found during the examination.  If the procedure report does not answer your questions, please call your gastroenterologist to clarify.  If you requested that your care partner not be given the details of your procedure findings, then the procedure report has been included in a sealed envelope for you to review at your convenience later.  YOU SHOULD EXPECT: Some feelings of bloating in the abdomen. Passage of more gas than usual.  Walking can help get rid of the air that was put into your GI tract during the procedure and reduce the bloating. If you had a lower endoscopy (such as a colonoscopy or flexible sigmoidoscopy) you may notice spotting of blood in your stool or on the toilet paper. If you underwent a bowel prep for your procedure, you may not have a normal bowel movement for a few days.  Please Note:  You might notice some irritation and congestion in your nose or some drainage.  This is from the oxygen used during your procedure.  There is no need for concern and it should clear up in a day or so.  SYMPTOMS TO REPORT IMMEDIATELY:   Following lower endoscopy (colonoscopy or flexible sigmoidoscopy):  Excessive amounts of blood in the stool  Significant tenderness or worsening of abdominal pains  Swelling of the abdomen that is new, acute  Fever of 100F or higher    For urgent or emergent issues, a gastroenterologist can be reached at any hour by calling [phone].   DIET: Your first meal following the procedure should be a small meal and then it is ok to progress to your normal diet. Heavy or fried foods are harder to digest and may make you feel nauseous or bloated.  Likewise, meals heavy in dairy and vegetables can increase bloating.  Drink plenty of fluids but you should avoid alcoholic beverages for 24  hours.  ACTIVITY:  You should plan to take it easy for the rest of today and you should NOT DRIVE or use heavy machinery until tomorrow (because of the sedation medicines used during the test).    FOLLOW UP: Our staff will call the number listed on your records the next business day following your procedure to check on you and address any questions or concerns that you may have regarding the information given to you following your procedure. If we do not reach you, we will leave a message.  However, if you are feeling well and you are not experiencing any problems, there is no need to return our call.  We will assume that you have returned to your regular daily activities without incident.  If any biopsies were taken you will be contacted by phone or by letter within the next 1-3 weeks.  Please call us at [phone] if you have not heard about the biopsies in 3 weeks.    SIGNATURES/CONFIDENTIALITY: You and/or your care partner have signed paperwork which will be entered into your electronic medical record.  These signatures attest to the fact that that the information above on your After Visit Summary has been reviewed and is understood.  Full responsibility of the confidentiality of this discharge information lies with you and/or your care-partner.   Resume medications. Information given on diverticulosis and high fiber diet with discharge instructions. 

## 2014-11-30 NOTE — Progress Notes (Signed)
A/ox3, pleased with MAC, report to RN 

## 2014-12-01 ENCOUNTER — Telehealth: Payer: Self-pay | Admitting: *Deleted

## 2014-12-01 NOTE — Telephone Encounter (Signed)
No answer, message left for the patient. 

## 2015-04-14 ENCOUNTER — Other Ambulatory Visit: Payer: Self-pay

## 2015-10-09 ENCOUNTER — Encounter: Payer: Self-pay | Admitting: Gastroenterology

## 2015-10-25 ENCOUNTER — Other Ambulatory Visit: Payer: Self-pay

## 2015-10-26 LAB — CEA: CEA: 4.7 ng/mL (ref 0.0–4.7)

## 2015-11-20 ENCOUNTER — Ambulatory Visit (AMBULATORY_SURGERY_CENTER): Payer: Self-pay | Admitting: *Deleted

## 2015-11-20 VITALS — Ht 71.0 in | Wt 223.0 lb

## 2015-11-20 DIAGNOSIS — Z85038 Personal history of other malignant neoplasm of large intestine: Secondary | ICD-10-CM

## 2015-11-20 NOTE — Progress Notes (Signed)
No egg or soy allergy known to patient  No issues with past sedation with any surgeries  or procedures, no intubation problems  No diet pills per patient No home 02 use per patient  No blood thinners per patient    

## 2015-12-04 ENCOUNTER — Encounter: Payer: 59 | Admitting: Gastroenterology

## 2015-12-22 ENCOUNTER — Encounter: Payer: Self-pay | Admitting: Gastroenterology

## 2015-12-22 ENCOUNTER — Ambulatory Visit (AMBULATORY_SURGERY_CENTER): Payer: 59 | Admitting: Gastroenterology

## 2015-12-22 VITALS — BP 112/72 | HR 51 | Temp 97.8°F | Resp 16 | Ht 71.0 in | Wt 223.0 lb

## 2015-12-22 DIAGNOSIS — Z85038 Personal history of other malignant neoplasm of large intestine: Secondary | ICD-10-CM

## 2015-12-22 DIAGNOSIS — D122 Benign neoplasm of ascending colon: Secondary | ICD-10-CM | POA: Diagnosis not present

## 2015-12-22 MED ORDER — SODIUM CHLORIDE 0.9 % IV SOLN: 500.0000 mL | INTRAVENOUS | Status: DC

## 2015-12-22 NOTE — Op Note (Signed)
[employer] Patient Name: Jay Pace Procedure Date: 12/22/2015 10:29 AM MRN: [id number] Endoscopist: Mallie Mussel L. Loletha Carrow , MD Age: 65 Referring MD:  Date of Birth: 1951-07-25 Gender: Male Procedure:                Colonoscopy Indications:              High risk colon cancer surveillance: Personal                            history of colon cancer (MSI neg, T1N0Mx in left                            colon, resected 11/2013) Normal colonoscopy 11/2014 Medicines:                Monitored Anesthesia Care Procedure:                Pre-Anesthesia Assessment:                           - Prior to the procedure, a History and Physical                            was performed, and patient medications and                            allergies were reviewed. The patient's tolerance of                            previous anesthesia was also reviewed. The risks                            and benefits of the procedure and the sedation                            options and risks were discussed with the patient.                            All questions were answered, and informed consent                            was obtained. Prior Anticoagulants: The patient has                            taken no previous anticoagulant or antiplatelet                            agents. ASA Grade Assessment: I - A normal, healthy                            patient. After reviewing the risks and benefits,                            the patient was deemed in satisfactory condition to  undergo the procedure.                           After obtaining informed consent, the colonoscope                            was passed under direct vision. Throughout the                            procedure, the patient's blood pressure, pulse, and                            oxygen saturations were monitored continuously. The                            Model CF-[id number]) scope was  introduced                            through the anus and advanced to the the cecum,                            identified by appendiceal orifice and ileocecal                            valve. The colonoscopy was performed without                            difficulty. The patient tolerated the procedure                            well. The quality of the bowel preparation was                            good. The ileocecal valve, appendiceal orifice, and                            rectum were photographed. The quality of the bowel                            preparation was evaluated using the BBPS [facility]                            Bowel Preparation Scale) with scores of: Right                            Colon = 2, Transverse Colon = 2 and Left Colon = 2.                            The total BBPS score equals 6. The bowel                            preparation used was Miralax. Scope In: 10:47:14 AM Scope Out: 10:58:13 AM Scope Withdrawal Time: 0 hours 8 minutes 38 seconds  Total Procedure Duration: 0 hours 10 minutes 59 seconds  Findings:      The perianal and digital rectal examinations were normal.      A 2 mm polyp was found in the mid ascending colon. The polyp was       sessile. The polyp was removed with a piecemeal technique using a cold       biopsy forceps. Resection and retrieval were complete.      There was evidence of a prior end-to-side colo-colonic anastomosis in       the descending colon. This was patent and was characterized by healthy       appearing mucosa.      The exam was otherwise without abnormality on direct and retroflexion       views. Complications:            No immediate complications. Estimated Blood Loss:     Estimated blood loss: none. Impression:               - One 2 mm polyp in the mid ascending colon,                            removed piecemeal using a cold biopsy forceps.                            Resected and retrieved.                            - Patent end-to-side colo-colonic anastomosis,                            characterized by healthy appearing mucosa.                           - The examination was otherwise normal on direct                            and retroflexion views. Recommendation:           - Patient has a contact number available for                            emergencies. The signs and symptoms of potential                            delayed complications were discussed with the                            patient. Return to normal activities tomorrow.                            Written discharge instructions were provided to the                            patient.                           - Resume previous diet.                           -  Continue present medications.                           - Await pathology results.                           - Repeat colonoscopy in 3 years for surveillance. Procedure Code(s):        --- Professional ---                           [phone], Colonoscopy, flexible; with biopsy, single                            or multiple CPT copyright 2016 [employer] Medical [employer]. All rights reserved. [name] L. Loletha Carrow, MD 12/22/2015 11:06:27 AM This report has been signed electronically. Number of Addenda: 0 Referring MD:      Shirline Frees

## 2015-12-22 NOTE — Patient Instructions (Signed)
YOU HAD AN ENDOSCOPIC PROCEDURE TODAY AT [facility]:   Refer to the procedure report that was given to you for any specific questions about what was found during the examination.  If the procedure report does not answer your questions, please call your gastroenterologist to clarify.  If you requested that your care partner not be given the details of your procedure findings, then the procedure report has been included in a sealed envelope for you to review at your convenience later.  YOU SHOULD EXPECT: Some feelings of bloating in the abdomen. Passage of more gas than usual.  Walking can help get rid of the air that was put into your GI tract during the procedure and reduce the bloating. If you had a lower endoscopy (such as a colonoscopy or flexible sigmoidoscopy) you may notice spotting of blood in your stool or on the toilet paper. If you underwent a bowel prep for your procedure, you may not have a normal bowel movement for a few days.  Please Note:  You might notice some irritation and congestion in your nose or some drainage.  This is from the oxygen used during your procedure.  There is no need for concern and it should clear up in a day or so.  SYMPTOMS TO REPORT IMMEDIATELY:   Following lower endoscopy (colonoscopy or flexible sigmoidoscopy):  Excessive amounts of blood in the stool  Significant tenderness or worsening of abdominal pains  Swelling of the abdomen that is new, acute  Fever of 100F or higher    For urgent or emergent issues, a gastroenterologist can be reached at any hour by calling [phone].   DIET: Your first meal following the procedure should be a small meal and then it is ok to progress to your normal diet. Heavy or fried foods are harder to digest and may make you feel nauseous or bloated.  Likewise, meals heavy in dairy and vegetables can increase bloating.  Drink plenty of fluids but you should avoid alcoholic beverages for 24  hours.  ACTIVITY:  You should plan to take it easy for the rest of today and you should NOT DRIVE or use heavy machinery until tomorrow (because of the sedation medicines used during the test).    FOLLOW UP: Our staff will call the number listed on your records the next business day following your procedure to check on you and address any questions or concerns that you may have regarding the information given to you following your procedure. If we do not reach you, we will leave a message.  However, if you are feeling well and you are not experiencing any problems, there is no need to return our call.  We will assume that you have returned to your regular daily activities without incident.  If any biopsies were taken you will be contacted by phone or by letter within the next 1-3 weeks.  Please call us at [phone] if you have not heard about the biopsies in 3 weeks.    SIGNATURES/CONFIDENTIALITY: You and/or your care partner have signed paperwork which will be entered into your electronic medical record.  These signatures attest to the fact that that the information above on your After Visit Summary has been reviewed and is understood.  Full responsibility of the confidentiality of this discharge information lies with you and/or your care-partner.  Polyp information given.  Next colonoscopy 3 years-2020.

## 2015-12-22 NOTE — Progress Notes (Signed)
Called to room to assist during endoscopic procedure.  Patient ID and intended procedure confirmed with present staff. Received instructions for my participation in the procedure from the performing physician.  

## 2015-12-22 NOTE — Progress Notes (Signed)
Patient awakening,vss,report to rn 

## 2015-12-25 ENCOUNTER — Telehealth: Payer: Self-pay | Admitting: *Deleted

## 2015-12-25 NOTE — Telephone Encounter (Signed)
  Follow up Call-  Call back number 12/22/2015 11/30/2014 10/05/2013  Post procedure Call Back phone  # [phone]  Permission to leave phone message Yes Yes Yes    LMOM

## 2015-12-26 ENCOUNTER — Encounter: Payer: Self-pay | Admitting: Gastroenterology

## 2018-11-09 ENCOUNTER — Encounter: Payer: Self-pay | Admitting: Gastroenterology

## 2018-11-13 ENCOUNTER — Encounter: Payer: Self-pay | Admitting: Gastroenterology

## 2018-12-09 ENCOUNTER — Telehealth: Payer: Self-pay | Admitting: *Deleted

## 2018-12-09 NOTE — Telephone Encounter (Signed)
Covid-19 travel screening questions  Have you traveled in the last 14 days? No If yes where?   Do you now or have you had a fever in the last 14 days? No  Do you have any respiratory symptoms of shortness of breath or cough now or in the last 14 days? No  Do you have a medical history of Congestive Heart Failure? No  Do you have a medical history of lung disease? No  Do you have any family members or close contacts with diagnosed or suspected Covid-19? No        

## 2018-12-10 ENCOUNTER — Ambulatory Visit (AMBULATORY_SURGERY_CENTER): Payer: Self-pay

## 2018-12-10 ENCOUNTER — Other Ambulatory Visit: Payer: Self-pay

## 2018-12-10 ENCOUNTER — Encounter: Payer: Self-pay | Admitting: Gastroenterology

## 2018-12-10 VITALS — Temp 97.6°F | Ht 71.0 in | Wt 228.8 lb

## 2018-12-10 DIAGNOSIS — Z85038 Personal history of other malignant neoplasm of large intestine: Secondary | ICD-10-CM

## 2018-12-10 MED ORDER — NA SULFATE-K SULFATE-MG SULF 17.5-3.13-1.6 GM/177ML PO SOLN: 1.0000 | Freq: Once | ORAL | 0 refills | Status: AC

## 2018-12-10 NOTE — Progress Notes (Signed)
Denies allergies to eggs or soy products. Denies complication of anesthesia or sedation. Denies use of weight loss medication. Denies use of O2.   Emmi instructions declined.   A 15.00 coupon for Suprep was given to the patient.

## 2018-12-24 ENCOUNTER — Encounter: Payer: 59 | Admitting: Gastroenterology

## 2019-01-20 ENCOUNTER — Encounter: Payer: Managed Care, Other (non HMO) | Admitting: Gastroenterology

## 2019-01-26 ENCOUNTER — Telehealth: Payer: Self-pay | Admitting: *Deleted

## 2019-01-26 NOTE — Telephone Encounter (Signed)
Pt returned call- RS his colon for 5-18 at 730 am with Dr Loletha Carrow- informed pt I will mail new instructions for his new date- Pt states he has a Associate Professor at home already -  Verified address   Lenard Galloway RN PV

## 2019-01-26 NOTE — Telephone Encounter (Signed)
Called pt to RS colon- LM to return my call. Jay Pace PV

## 2019-02-05 ENCOUNTER — Telehealth: Payer: Self-pay | Admitting: *Deleted

## 2019-02-05 NOTE — Telephone Encounter (Signed)
Covid-19 travel screening questions  Have you traveled in the last 14 days?no If yes where?  Do you now or have you had a fever in the last 14 days?no  Do you have any respiratory symptoms of shortness of breath or cough now or in the last 14 days?no  Do you have a medical history of Congestive Heart Failure?no  Do you have a medical history of lung disease?no  Do you have any family members or close contacts with diagnosed or suspected Covid-19?no    Pt aware of care partner policy and was told to bring a mask with him if he has one available. SM

## 2019-02-08 ENCOUNTER — Encounter: Payer: Self-pay | Admitting: Gastroenterology

## 2019-02-08 ENCOUNTER — Ambulatory Visit (AMBULATORY_SURGERY_CENTER): Payer: Managed Care, Other (non HMO) | Admitting: Gastroenterology

## 2019-02-08 ENCOUNTER — Other Ambulatory Visit: Payer: Self-pay

## 2019-02-08 VITALS — BP 135/66 | HR 49 | Temp 98.6°F | Resp 15 | Ht 71.0 in | Wt 228.0 lb

## 2019-02-08 DIAGNOSIS — Z85038 Personal history of other malignant neoplasm of large intestine: Secondary | ICD-10-CM

## 2019-02-08 MED ORDER — SODIUM CHLORIDE 0.9 % IV SOLN
500.0000 mL | Freq: Once | INTRAVENOUS | Status: DC
Start: 2019-02-08 — End: 2019-02-08

## 2019-02-08 NOTE — Progress Notes (Signed)
Report given to PACU, vss 

## 2019-02-08 NOTE — Patient Instructions (Signed)
YOU HAD AN ENDOSCOPIC PROCEDURE TODAY AT [facility]:   Refer to the procedure report that was given to you for any specific questions about what was found during the examination.  If the procedure report does not answer your questions, please call your gastroenterologist to clarify.  If you requested that your care partner not be given the details of your procedure findings, then the procedure report has been included in a sealed envelope for you to review at your convenience later.  YOU SHOULD EXPECT: Some feelings of bloating in the abdomen. Passage of more gas than usual.  Walking can help get rid of the air that was put into your GI tract during the procedure and reduce the bloating. If you had a lower endoscopy (such as a colonoscopy or flexible sigmoidoscopy) you may notice spotting of blood in your stool or on the toilet paper. If you underwent a bowel prep for your procedure, you may not have a normal bowel movement for a few days.  Please Note:  You might notice some irritation and congestion in your nose or some drainage.  This is from the oxygen used during your procedure.  There is no need for concern and it should clear up in a day or so.  SYMPTOMS TO REPORT IMMEDIATELY:   Following lower endoscopy (colonoscopy or flexible sigmoidoscopy):  Excessive amounts of blood in the stool  Significant tenderness or worsening of abdominal pains  Swelling of the abdomen that is new, acute  Fever of 100F or higher   For urgent or emergent issues, a gastroenterologist can be reached at any hour by calling [phone].   DIET:  We do recommend a small meal at first, but then you may proceed to your regular diet.  Drink plenty of fluids but you should avoid alcoholic beverages for 24 hours.  ACTIVITY:  You should plan to take it easy for the rest of today and you should NOT DRIVE or use heavy machinery until tomorrow (because of the sedation medicines used during the test).     FOLLOW UP: Our staff will call the number listed on your records 48-72 hours following your procedure to check on you and address any questions or concerns that you may have regarding the information given to you following your procedure. If we do not reach you, we will leave a message.  We will attempt to reach you two times.  During this call, we will ask if you have developed any symptoms of COVID 19. If you develop any symptoms (for example fever, flu-like symptoms, shortness of breath, cough etc.) before then, please call [phone].  If any biopsies were taken you will be contacted by phone or by letter within the next 1-3 weeks.  Please call us at [phone] if you have not heard about the biopsies in 3 weeks.    SIGNATURES/CONFIDENTIALITY: You and/or your care partner have signed paperwork which will be entered into your electronic medical record.  These signatures attest to the fact that that the information above on your After Visit Summary has been reviewed and is understood.  Full responsibility of the confidentiality of this discharge information lies with you and/or your care-partner.

## 2019-02-08 NOTE — Op Note (Signed)
[facility] Patient Name: Jay Pace Procedure Date: 02/08/2019 7:30 AM MRN: [id number] Endoscopist: Mallie Mussel L. Loletha Carrow , MD Age: 68 Referring MD:  Date of Birth: 11-24-1950 Gender: Male Account #: [id number] Procedure:                Colonoscopy Indications:              Increased risk colon cancer surveillance: Personal                            history of colon cancer (resected 2015, no polyps                            2016, diminutive TA 11/2015) Medicines:                Monitored Anesthesia Care Procedure:                Pre-Anesthesia Assessment:                           - Prior to the procedure, a History and Physical                            was performed, and patient medications and                            allergies were reviewed. The patient's tolerance of                            previous anesthesia was also reviewed. The risks                            and benefits of the procedure and the sedation                            options and risks were discussed with the patient.                            All questions were answered, and informed consent                            was obtained. Prior Anticoagulants: The patient has                            taken no previous anticoagulant or antiplatelet                            agents. ASA Grade Assessment: II - A patient with                            mild systemic disease. After reviewing the risks                            and benefits, the patient was deemed in  satisfactory condition to undergo the procedure.                           After obtaining informed consent, the colonoscope                            was passed under direct vision. Throughout the                            procedure, the patient's blood pressure, pulse, and                            oxygen saturations were monitored continuously. The                            Model CF-HQ190L [id number])  scope was introduced                            through the anus and advanced to the the cecum,                            identified by appendiceal orifice and ileocecal                            valve. The colonoscopy was performed without                            difficulty. The patient tolerated the procedure                            well. The quality of the bowel preparation was good. Scope In: 7:68:11 AM Scope Out: 7:57:07 AM Scope Withdrawal Time: 0 hours 8 minutes 14 seconds  Total Procedure Duration: 0 hours 10 minutes 9 seconds  Findings:                 The perianal and digital rectal examinations were                            normal.                           There was evidence of a prior end-to-side                            colo-colonic anastomosis in the descending colon.                            This was patent and was characterized by healthy                            appearing mucosa.                           Internal hemorrhoids were found.  The exam was otherwise without abnormality on                            direct and retroflexion views. Complications:            No immediate complications. Estimated Blood Loss:     Estimated blood loss: none. Impression:               - Patent end-to-side colo-colonic anastomosis,                            characterized by healthy appearing mucosa.                           - Internal hemorrhoids.                           - The examination was otherwise normal on direct                            and retroflexion views.                           - No specimens collected. Recommendation:           - Patient has a contact number available for                            emergencies. The signs and symptoms of potential                            delayed complications were discussed with the                            patient. Return to normal activities tomorrow.                             Written discharge instructions were provided to the                            patient.                           - Resume previous diet.                           - Continue present medications.                           - Repeat colonoscopy in 5 years for surveillance. Demosthenes Virnig L. Loletha Carrow, MD 02/08/2019 8:05:09 AM This report has been signed electronically.

## 2019-02-10 ENCOUNTER — Telehealth: Payer: Self-pay | Admitting: *Deleted

## 2019-02-10 NOTE — Telephone Encounter (Signed)
  Follow up Call-  Call back number 02/08/2019  Post procedure Call Back phone  # [phone]  Permission to leave phone message No  Some recent data might be hidden     Patient questions:  Do you have a fever, pain , or abdominal swelling? No. Pain Score  0 *  Have you tolerated food without any problems? Yes.    Have you been able to return to your normal activities? Yes.    Do you have any questions about your discharge instructions: Diet   No. Medications  No. Follow up visit  No.  Do you have questions or concerns about your Care? No.  Actions: * If pain score is 4 or above: No action needed, pain <4.    1. Have you developed a fever since your procedure? no  2.   Have you had an respiratory symptoms (SOB or cough) since your procedure? no  3.   Have you tested positive for COVID 19 since your procedure no  3.   Have you had any family members/close contacts diagnosed with the COVID 19 since your procedure?  no   If any of these questions are a yes, please inquire if patient has been seen by family doctor and route this note to Joylene John, Therapist, sports.

## 2019-11-21 ENCOUNTER — Ambulatory Visit: Payer: Managed Care, Other (non HMO) | Attending: Internal Medicine

## 2019-11-21 ENCOUNTER — Other Ambulatory Visit: Payer: Self-pay

## 2019-11-21 DIAGNOSIS — Z23 Encounter for immunization: Secondary | ICD-10-CM

## 2019-11-21 NOTE — Progress Notes (Signed)
   Covid-19 Vaccination Clinic  Name:  Jay Pace    MRN: [id number] DOB: 1951-09-16  11/21/2019  Mr. Jay Pace was observed post Covid-19 immunization for 15 minutes without incidence. He was provided with Vaccine Information Sheet and instruction to access the V-Safe system.   Mr. Jay Pace was instructed to call 911 with any severe reactions post vaccine: Marland Kitchen Difficulty breathing  . Swelling of your face and throat  . A fast heartbeat  . A bad rash all over your body  . Dizziness and weakness    Immunizations Administered    Name Date Dose VIS Date Route   Pfizer COVID-19 Vaccine 11/21/2019  8:35 AM 0.3 mL 09/03/2019 Intramuscular   Manufacturer: [place]   Lot: [id number]   [place]: [id number]

## 2019-12-21 ENCOUNTER — Ambulatory Visit: Payer: Managed Care, Other (non HMO) | Attending: Internal Medicine

## 2019-12-21 DIAGNOSIS — Z23 Encounter for immunization: Secondary | ICD-10-CM

## 2019-12-21 NOTE — Progress Notes (Signed)
   Covid-19 Vaccination Clinic  Name:  Jay Pace    MRN: [id number] DOB: 08/11/1951  12/21/2019  [name] was observed post Covid-19 immunization for 15 minutes without incident. He was provided with Vaccine Information Sheet and instruction to access the V-Safe system.   [name] was instructed to call 911 with any severe reactions post vaccine: Marland Kitchen Difficulty breathing  . Swelling of face and throat  . A fast heartbeat  . A bad rash all over body  . Dizziness and weakness   Immunizations Administered    Name Date Dose VIS Date Route   Pfizer COVID-19 Vaccine 12/21/2019  8:21 AM 0.3 mL 09/03/2019 Intramuscular   Manufacturer: [place]   Lot: [id number]   [place]: [id number]

## 2020-10-17 ENCOUNTER — Ambulatory Visit
Admission: RE | Admit: 2020-10-17 | Discharge: 2020-10-17 | Disposition: A | Discharge status: Home / Self Care | Payer: Managed Care, Other (non HMO) | Source: Ambulatory Visit | Attending: Family Medicine | Admitting: Family Medicine

## 2020-10-17 ENCOUNTER — Other Ambulatory Visit: Payer: Self-pay | Admitting: Family Medicine

## 2020-10-17 DIAGNOSIS — M79605 Pain in left leg: Secondary | ICD-10-CM

## 2020-10-17 DIAGNOSIS — I824Z9 Acute embolism and thrombosis of unspecified deep veins of unspecified distal lower extremity: Secondary | ICD-10-CM

## 2021-01-29 ENCOUNTER — Other Ambulatory Visit: Payer: Managed Care, Other (non HMO)

## 2021-01-29 ENCOUNTER — Other Ambulatory Visit: Payer: Self-pay | Admitting: Family Medicine

## 2021-01-29 DIAGNOSIS — Z86718 Personal history of other venous thrombosis and embolism: Secondary | ICD-10-CM

## 2021-02-01 ENCOUNTER — Ambulatory Visit
Admission: RE | Admit: 2021-02-01 | Discharge: 2021-02-01 | Disposition: A | Discharge status: Home / Self Care | Payer: Managed Care, Other (non HMO) | Source: Ambulatory Visit | Attending: Family Medicine | Admitting: Family Medicine

## 2021-02-01 DIAGNOSIS — Z86718 Personal history of other venous thrombosis and embolism: Secondary | ICD-10-CM

## 2021-02-12 IMAGING — US US EXTREM LOW VENOUS*L*
1 series · 13 of 24 positions shown · non-contrast
Comparison: None.

CLINICAL DATA: 69-year-old male with left leg pain



[Series 1: us extrem low venous*left* · 0.06mm/px · 13 of 43 slices shown]
[im 1/43]
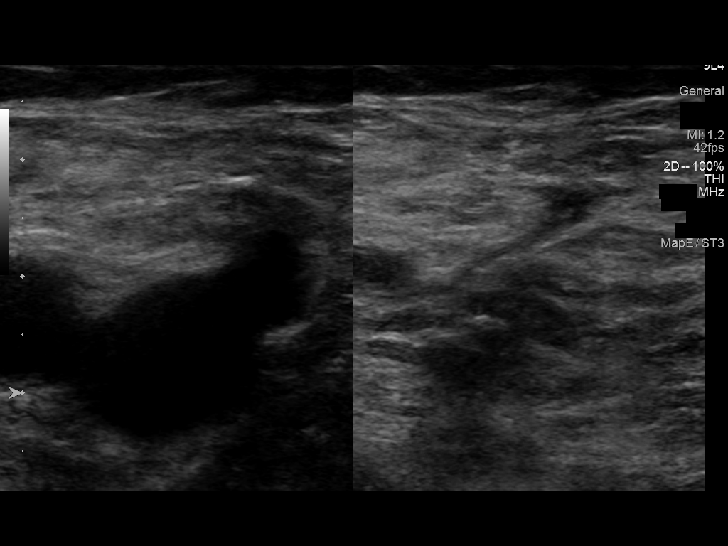
[im 4/43]
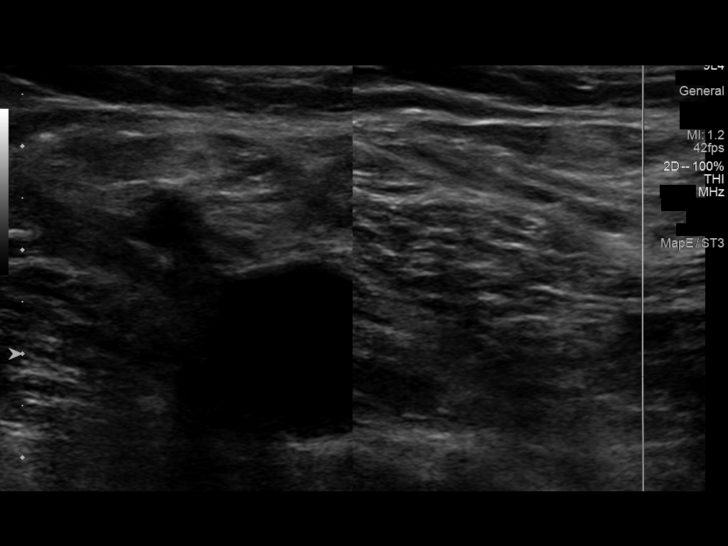
[im 8/43]
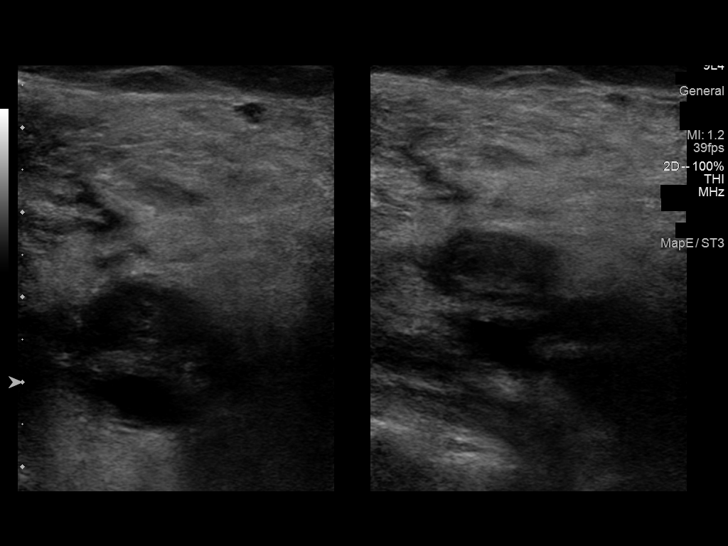
[im 11/43]
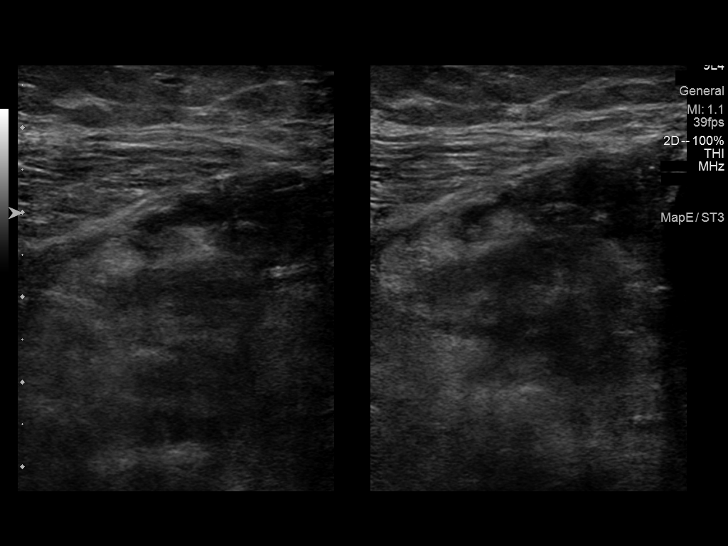
[im 15/43]
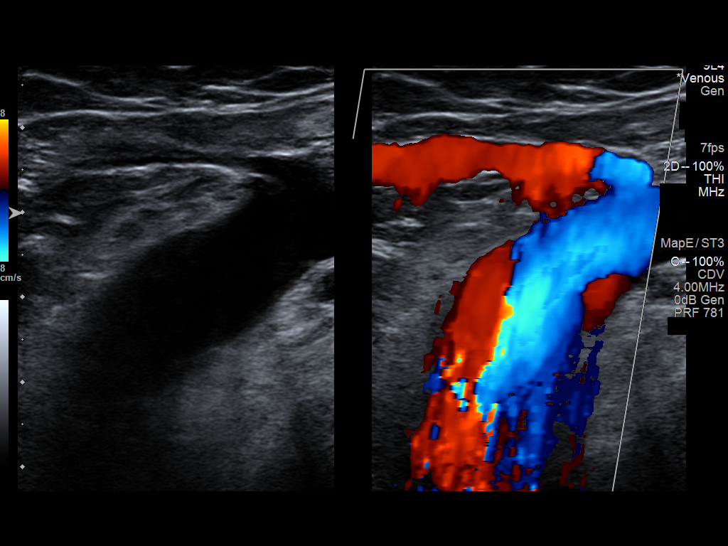
[im 19/43]
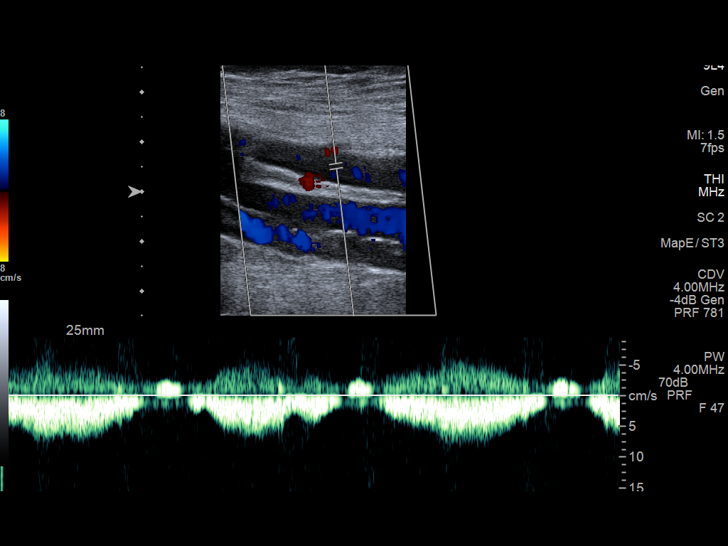
[im 22/43]
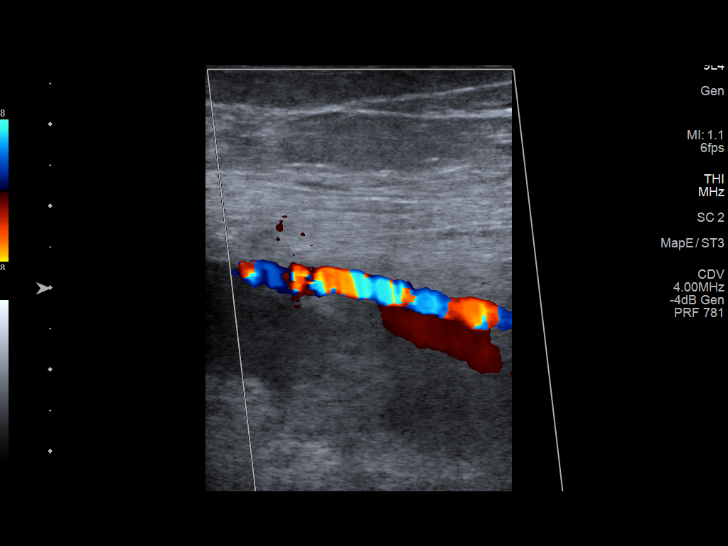
[im 24/43]
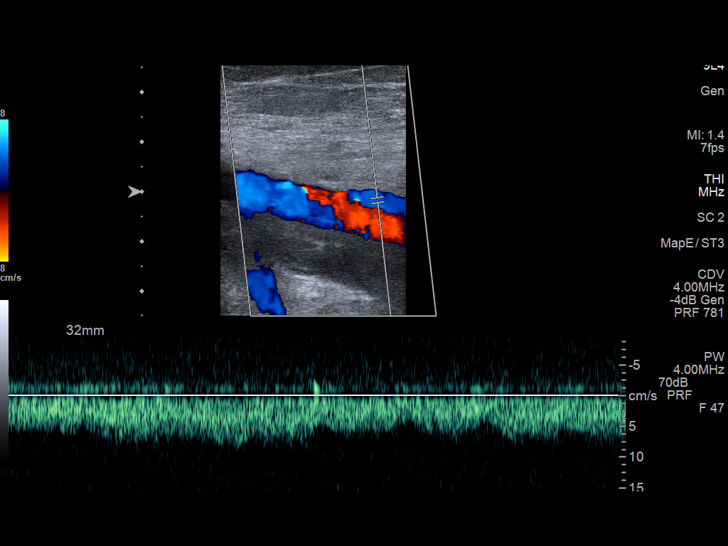
[im 28/43]
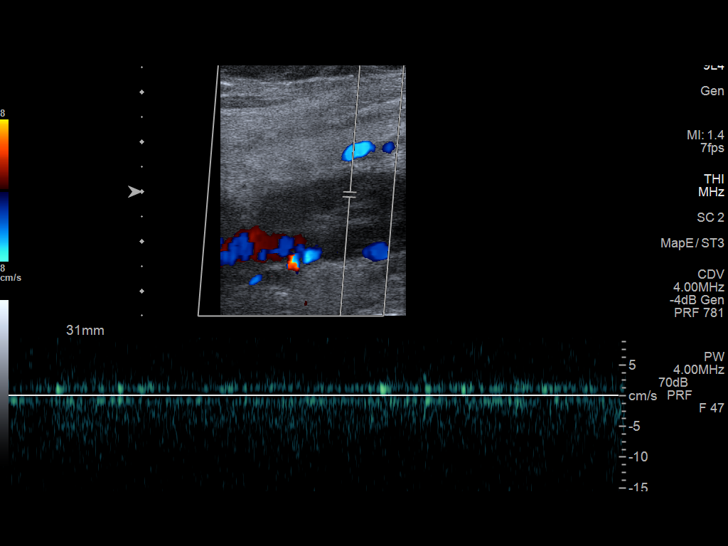
[im 32/43]
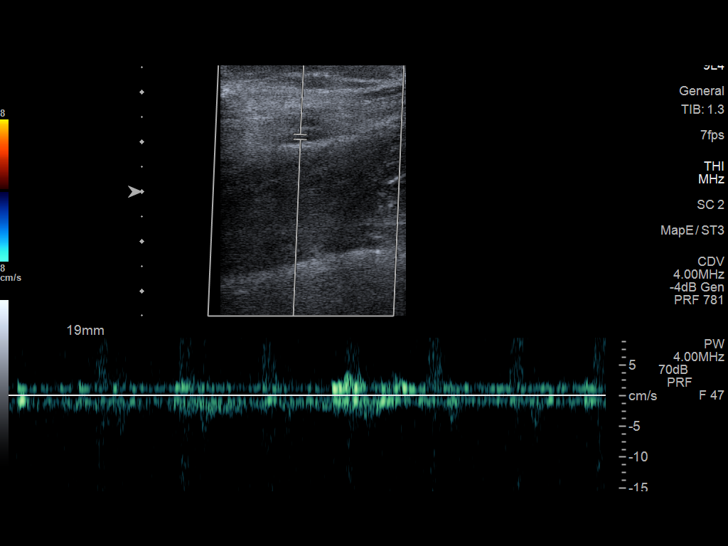
[im 35/43]
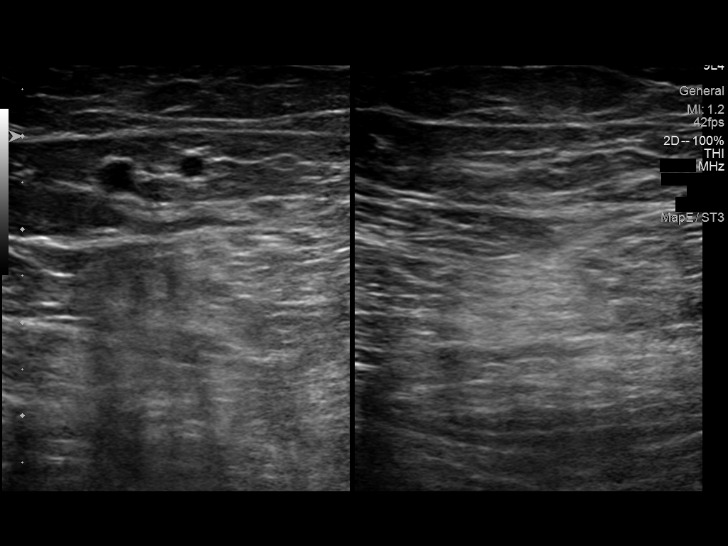
[im 39/43]
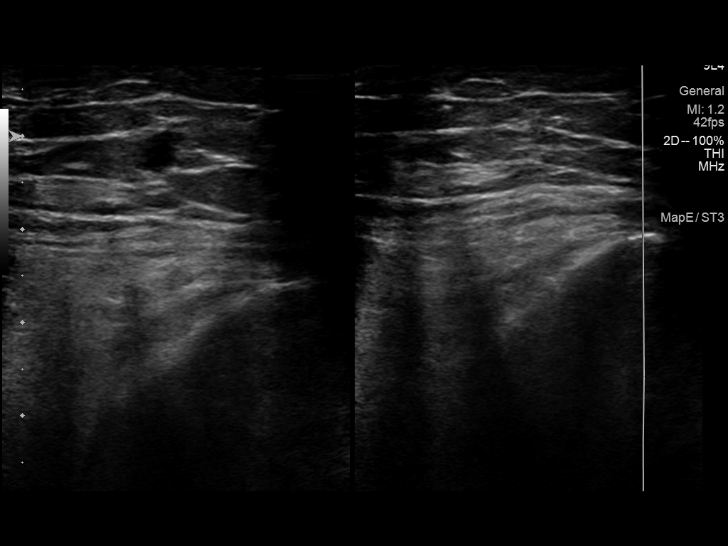
[im 43/43]
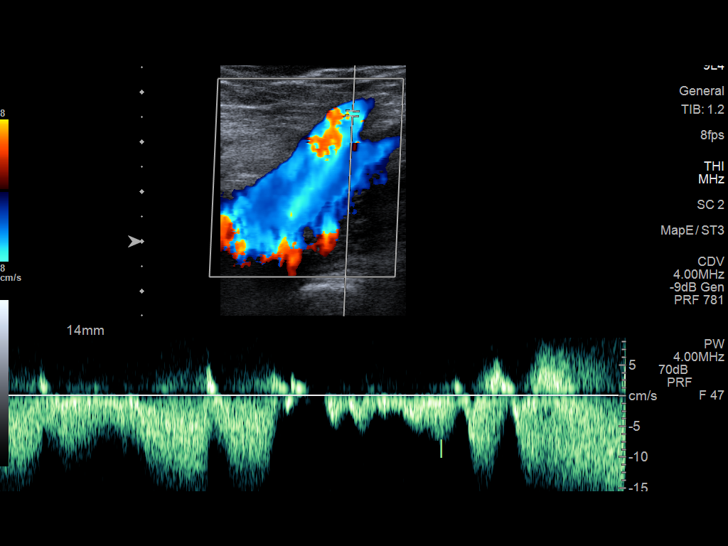

[13 of 24 positions shown; findings below may reference images not displayed]

FINDINGS: Contralateral Common Femoral Vein: Respiratory phasicity is normal
and symmetric with the symptomatic side. No evidence of thrombus.
Normal compressibility.

Common Femoral Vein: No evidence of thrombus. Normal
compressibility, respiratory phasicity and response to augmentation.

Saphenofemoral Junction: No evidence of thrombus. Normal
compressibility and flow on color Doppler imaging.

Profunda Femoral Vein: No evidence of thrombus. Normal
compressibility and flow on color Doppler imaging.

Femoral Vein: Occlusive DVT of the distal femoral vein without flow,
and without compressible vein.

Popliteal Vein: Occlusive DVT of the popliteal vein without flow and
with incompressible vein.

Calf Veins: Occlusive DVT of the posterior tibial vein. Peroneal
vein not visualized.

Superficial Great Saphenous Vein: No evidence of thrombus. Normal
compressibility and flow on color Doppler imaging.

Other Findings:  None.
IMPRESSION: Sonographic survey left lower extremity positive for occlusive DVT
of distal femoral vein, popliteal vein, extending into the
left-sided tibial veins.

Negative for proximal DVT of the left common femoral vein/proximal
femoral vein.

## 2021-05-25 ENCOUNTER — Other Ambulatory Visit: Payer: Self-pay | Admitting: Urology

## 2021-05-25 DIAGNOSIS — R972 Elevated prostate specific antigen [PSA]: Secondary | ICD-10-CM

## 2021-06-15 ENCOUNTER — Other Ambulatory Visit: Payer: Managed Care, Other (non HMO)

## 2021-06-30 ENCOUNTER — Other Ambulatory Visit: Payer: Managed Care, Other (non HMO)

## 2021-07-04 ENCOUNTER — Other Ambulatory Visit: Payer: Self-pay

## 2021-07-04 ENCOUNTER — Ambulatory Visit
Admission: RE | Admit: 2021-07-04 | Discharge: 2021-07-04 | Disposition: A | Discharge status: Home / Self Care | Payer: Managed Care, Other (non HMO) | Source: Ambulatory Visit | Attending: Urology | Admitting: Urology

## 2021-07-04 DIAGNOSIS — R972 Elevated prostate specific antigen [PSA]: Secondary | ICD-10-CM

## 2021-07-04 MED ORDER — GADOBENATE DIMEGLUMINE 529 MG/ML IV SOLN
20.0000 mL | Freq: Once | INTRAVENOUS | Status: AC | PRN
  Administered 2021-07-04: 20 mL via INTRAVENOUS

## 2021-09-18 ENCOUNTER — Other Ambulatory Visit: Payer: Self-pay | Admitting: Urology

## 2021-10-22 NOTE — Progress Notes (Signed)
Covid test on 2/3 at 0800am come thru main entrance at [facility] a seat in the lobby on the right as you come thru the door.  Call [phone] and give them your name and let them know you are here for covid testing .                Jay Pace  10/22/2021   Your procedure is scheduled on:             10/31/21   Report to [facility] Main  Entrance   Report to admitting at  0600 AM     Call this number if you have problems the morning of surgery [phone]    Remember: Do not eat food , candy gum or mints :After Midnight. You may have clear liquids from midnight until __  0530am.   Clear liquid diet on the day before surgeyr.  Mix 255 g of Miralax with 64 ounces of Gatorade.  AT 4pm drink a glass every 15-30 minutes until completed.     CLEAR LIQUID DIET   Foods Allowed                                                                       Coffee and tea, regular and decaf                              Plain Jell-O any favor except red or purple                                            Fruit ices (not with fruit pulp)                                      Iced Popsicles                                     Carbonated beverages, regular and diet                                    Cranberry, grape and apple juices Sports drinks like Gatorade Lightly seasoned clear broth or consume(fat free) Sugar   _____________________________________________________________________    BRUSH YOUR TEETH MORNING OF SURGERY AND RINSE YOUR MOUTH OUT, NO CHEWING GUM CANDY OR MINTS.     Take these medicines the morning of surgery with A SIP OF WATER:  proscar, protonix   DO NOT TAKE ANY DIABETIC MEDICATIONS DAY OF YOUR SURGERY                               You may not have any metal on your body including hair pins and              piercings  Do not wear jewelry,  make-up, lotions, powders or perfumes, deodorant             Do not wear nail polish on your fingernails.  Do not  shave  48 hours prior to surgery.              Men may shave face and neck.   Do not bring valuables to the hospital. [place].  Contacts, dentures or bridgework may not be worn into surgery.  Leave suitcase in the car. After surgery it may be brought to your room.     Patients discharged the day of surgery will not be allowed to drive home. IF YOU ARE HAVING SURGERY AND GOING HOME THE SAME DAY, YOU MUST HAVE AN ADULT TO DRIVE YOU HOME AND BE WITH YOU FOR 24 HOURS. YOU MAY GO HOME BY TAXI OR UBER OR ORTHERWISE, BUT AN ADULT MUST ACCOMPANY YOU HOME AND STAY WITH YOU FOR 24 HOURS.  Name and phone number of your driver:  Special Instructions: N/A              Please read over the following fact sheets you were given: _____________________________________________________________________  [facility] - Preparing for Surgery Before surgery, you can play an important role.  Because skin is not sterile, your skin needs to be as free of germs as possible.  You can reduce the number of germs on your skin by washing with CHG (chlorahexidine gluconate) soap before surgery.  CHG is an antiseptic cleaner which kills germs and bonds with the skin to continue killing germs even after washing. Please DO NOT use if you have an allergy to CHG or antibacterial soaps.  If your skin becomes reddened/irritated stop using the CHG and inform your nurse when you arrive at Short Stay. Do not shave (including legs and underarms) for at least 48 hours prior to the first CHG shower.  You may shave your face/neck. Please follow these instructions carefully:  1.  Shower with CHG Soap the night before surgery and the  morning of Surgery.  2.  If you choose to wash your hair, wash your hair first as usual with your  normal  shampoo.  3.  After you shampoo, rinse your hair and body thoroughly to remove the  shampoo.                           4.  Use CHG as you would any other  liquid soap.  You can apply chg directly  to the skin and wash                       Gently with a scrungie or clean washcloth.  5.  Apply the CHG Soap to your body ONLY FROM THE NECK DOWN.   Do not use on face/ open                           Wound or open sores. Avoid contact with eyes, ears mouth and genitals (private parts).                       Wash face,  Genitals (private parts) with your normal soap.             6.  Wash thoroughly, paying special  attention to the area where your surgery  will be performed.  7.  Thoroughly rinse your body with warm water from the neck down.  8.  DO NOT shower/wash with your normal soap after using and rinsing off  the CHG Soap.                9.  Pat yourself dry with a clean towel.            10.  Wear clean pajamas.            11.  Place clean sheets on your bed the night of your first shower and do not  sleep with pets. Day of Surgery : Do not apply any lotions/deodorants the morning of surgery.  Please wear clean clothes to the hospital/surgery center.  FAILURE TO FOLLOW THESE INSTRUCTIONS MAY RESULT IN THE CANCELLATION OF YOUR SURGERY PATIENT SIGNATURE_________________________________  NURSE SIGNATURE__________________________________  ________________________________________________________________________

## 2021-10-22 NOTE — Progress Notes (Addendum)
Anesthesia Review:  PCP: DR  Shirline Frees  lvmm and requested LOV notes for llast year on pt. [phone] Telemedicine visit for covid on 10/03/20.   10/17/20- pain left leg visit.  Recovering from covid pneumonia visit 12/01/20- ankle/foot swelling visit - has been on Xarelto since 09/2020.  01/29/21- followup of DVT- discontinued Xarelto approx 2 weeks ago per note  8/17-22- Annual Physical Visit  Cardiologist : none  Chest x-ray : EKG : Echo : Stress test: Cardiac Cath :  Activity level: can do a flight of stairs without difficulty  Sleep Study/ CPAP : none  Fasting Blood Sugar :      / Checks Blood Sugar -- times a day:   Blood Thinner/ Instructions /Last Dose: ASA / Instructions/ Last Dose :   81 mg aspirin  Hx of covid 09/2020 had pneumonia and blood clots .  PT reported on blood thinners for a while.  No longer on blood thinners.  Was followed by PCP.   Covid test on 10/26/20 at 0800am

## 2021-10-24 ENCOUNTER — Encounter (HOSPITAL_COMMUNITY): Payer: Self-pay | Admitting: Physician Assistant

## 2021-10-24 ENCOUNTER — Encounter (HOSPITAL_COMMUNITY): Payer: Self-pay

## 2021-10-24 ENCOUNTER — Encounter (HOSPITAL_COMMUNITY)
Admission: RE | Admit: 2021-10-24 | Discharge: 2021-10-24 | Disposition: A | Discharge status: Home / Self Care | Payer: Managed Care, Other (non HMO) | Source: Ambulatory Visit | Attending: Urology | Admitting: Urology

## 2021-10-24 ENCOUNTER — Other Ambulatory Visit: Payer: Self-pay

## 2021-10-24 VITALS — BP 155/79 | HR 55 | Temp 97.8°F | Resp 16 | Ht 70.5 in | Wt 223.0 lb

## 2021-10-24 DIAGNOSIS — N400 Enlarged prostate without lower urinary tract symptoms: Secondary | ICD-10-CM | POA: Diagnosis not present

## 2021-10-24 DIAGNOSIS — Z20822 Contact with and (suspected) exposure to covid-19: Secondary | ICD-10-CM | POA: Diagnosis not present

## 2021-10-24 DIAGNOSIS — Z01812 Encounter for preprocedural laboratory examination: Secondary | ICD-10-CM | POA: Insufficient documentation

## 2021-10-24 DIAGNOSIS — Z01818 Encounter for other preprocedural examination: Secondary | ICD-10-CM

## 2021-10-24 HISTORY — DX: Pneumonia, unspecified organism: J18.9

## 2021-10-24 HISTORY — DX: Acute embolism and thrombosis of unspecified deep veins of unspecified lower extremity: I82.409

## 2021-10-24 LAB — BASIC METABOLIC PANEL
Anion gap: 8 (ref 5–15)
BUN: 24 mg/dL — ABNORMAL HIGH (ref 8–23)
CO2: 24 mmol/L (ref 22–32)
Calcium: 8.8 mg/dL — ABNORMAL LOW (ref 8.9–10.3)
Chloride: 108 mmol/L (ref 98–111)
Creatinine, Ser: 0.86 mg/dL (ref 0.61–1.24)
GFR, Estimated: >60 mL/min (ref >60)
Glucose, Bld: 101 mg/dL — ABNORMAL HIGH (ref 70–99)
Potassium: 3.8 mmol/L (ref 3.5–5.1)
Sodium: 140 mmol/L (ref 135–145)

## 2021-10-24 LAB — CBC
HCT: 45.1 % (ref 39.0–52.0)
Hemoglobin: 14.8 g/dL (ref 13.0–17.0)
MCH: 31.8 pg (ref 26.0–34.0)
MCHC: 32.8 g/dL (ref 30.0–36.0)
MCV: 96.8 fL (ref 80.0–100.0)
Platelets: 210 10*3/uL (ref 150–400)
RBC: 4.66 MIL/uL (ref 4.22–5.81)
RDW: 13 % (ref 11.5–15.5)
WBC: 6.2 10*3/uL (ref 4.0–10.5)
nRBC: 0 % (ref 0.0–0.2)

## 2021-10-26 ENCOUNTER — Encounter (HOSPITAL_COMMUNITY): Payer: Self-pay

## 2021-10-26 ENCOUNTER — Encounter (HOSPITAL_COMMUNITY)
Admission: RE | Admit: 2021-10-26 | Discharge: 2021-10-26 | Disposition: A | Discharge status: Home / Self Care | Payer: Managed Care, Other (non HMO) | Source: Ambulatory Visit | Attending: Urology | Admitting: Urology

## 2021-10-26 DIAGNOSIS — Z01812 Encounter for preprocedural laboratory examination: Secondary | ICD-10-CM

## 2021-10-31 ENCOUNTER — Ambulatory Visit (HOSPITAL_COMMUNITY): Admission: RE | Admit: 2021-10-31 | Payer: Managed Care, Other (non HMO) | Source: Home / Self Care | Admitting: Urology

## 2021-10-31 ENCOUNTER — Encounter (HOSPITAL_COMMUNITY): Admission: RE | Payer: Self-pay | Source: Home / Self Care

## 2021-10-31 LAB — TYPE AND SCREEN
ABO/RH(D): A POS
Antibody Screen: NEGATIVE

## 2021-10-31 SURGERY — PROSTATECTOMY, SIMPLE, ROBOT-ASSISTED
Anesthesia: General

## 2022-10-02 DIAGNOSIS — R3912 Poor urinary stream: Secondary | ICD-10-CM | POA: Diagnosis not present

## 2022-11-16 DIAGNOSIS — J019 Acute sinusitis, unspecified: Secondary | ICD-10-CM | POA: Diagnosis not present

## 2022-12-27 ENCOUNTER — Encounter: Payer: Self-pay | Admitting: Gastroenterology

## 2023-02-03 DIAGNOSIS — H524 Presbyopia: Secondary | ICD-10-CM | POA: Diagnosis not present

## 2023-02-03 DIAGNOSIS — H18619 Keratoconus, stable, unspecified eye: Secondary | ICD-10-CM | POA: Diagnosis not present

## 2023-02-27 ENCOUNTER — Ambulatory Visit: Payer: Managed Care, Other (non HMO) | Admitting: Gastroenterology

## 2023-03-26 ENCOUNTER — Ambulatory Visit: Payer: Medicare Other | Admitting: Gastroenterology

## 2023-03-26 ENCOUNTER — Encounter: Payer: Self-pay | Admitting: Gastroenterology

## 2023-03-26 VITALS — BP 132/70 | HR 60 | Ht 70.5 in | Wt 215.5 lb

## 2023-03-26 DIAGNOSIS — Z85038 Personal history of other malignant neoplasm of large intestine: Secondary | ICD-10-CM

## 2023-03-26 MED ORDER — NA SULFATE-K SULFATE-MG SULF 17.5-3.13-1.6 GM/177ML PO SOLN: 1.0000 | Freq: Once | ORAL | 0 refills | Status: AC

## 2023-03-26 NOTE — Progress Notes (Signed)
[facility] Consult Note:  History: Jay Pace 03/26/2023  Referring provider: Noberto Retort, MD  Reason for consult/chief complaint: Colon Cancer (Want to discuss having a soon colonoscopy)   Subjective  HPI: Summary of pertinent GI issues: Personal history of colon cancer with resection in 2015.  No polyps on 2016 colonoscopy. Diminutive tubular adenoma March 2017, no polyps May 2020, 5-year recall recommended.  Jay Pace has been feeling well from a digestive standpoint.  Denies abdominal pain, altered bowel habits or rectal bleeding.  No nausea or vomiting, appetite good, weight purposely down through some dietary changes and exercise.  He and his wife were concerned that 5-year interval for colonoscopy would be too long.  Learning more about his history, that concern stems from him having had a colonoscopy with Dr. Juanda Pace in January 2008 with no polyps were found and he was advised to come back in 10 years.  However, colon cancer was discovered 7 years later in January 2015.  He had no reported polyps on a colonoscopy by Dr. Juanda Pace in March 2016, but I found a small tubular adenoma in March 2017.   ROS:  Review of Systems He denies chest pain dyspnea or dysuria  Past Medical History: Past Medical History:  Diagnosis Date   Allergic rhinitis    Allergy    Colon cancer (HCC)    Colon polyps    DVT (deep venous thrombosis) (HCC)    in leg in 09/2020 post covid   GERD (gastroesophageal reflux disease)    Hypercholesterolemia    Patient denies   Internal hemorrhoids    Keratoconus    Pneumonia    hx of 1/22 due to covid     Past Surgical History: Past Surgical History:  Procedure Laterality Date   COLONOSCOPY     KNEE ARTHROSCOPY Right    LAPAROSCOPIC PARTIAL COLECTOMY N/A 11/09/2013   Procedure: LAPAROSCOPIC ASSISTED PARTIAL COLECTOMY;  Surgeon: Jay Pollack, MD;  Location: [facility];  Service: General;  Laterality: N/A;   WISDOM  TOOTH EXTRACTION       Family History: Family History  Problem Relation Age of Onset   Heart disease Mother    Stroke Father    Bladder Cancer Father    Cancer Father        bladder   Breast cancer Sister    Cancer Sister        breast and ovarian   Ovarian cancer Sister    Cancer Sister        breast   Breast cancer Sister        x 2   Hypertension Brother    Liver cancer Brother    Stomach cancer Maternal Grandmother    Colon cancer Neg Hx    Colon polyps Neg Hx    Rectal cancer Neg Hx    Esophageal cancer Neg Hx     Social History: Social History   Socioeconomic History   Marital status: Married    Spouse name: Not on file   Number of children: 1   Years of education: Not on file   Highest education level: Not on file  Occupational History   Occupation: IT /retired    Associate Professor: [employer]  Tobacco Use   Smoking status: Never   Smokeless tobacco: Never  Vaping Use   Vaping Use: Never used  Substance and Sexual Activity   Alcohol use: No    Alcohol/week: 0.0 standard drinks of alcohol    Comment:  rare   Drug use: No   Sexual activity: Not on file  Other Topics Concern   Not on file  Social History Narrative   Not on file   Social Determinants of Health   Financial Resource Strain: Not on file  Food Insecurity: Not on file  Transportation Needs: Not on file  Physical Activity: Not on file  Stress: Not on file  Social Connections: Not on file    Allergies: No Known Allergies  Outpatient Meds: Current Outpatient Medications  Medication Sig Dispense Refill   aspirin EC 81 MG tablet Take 81 mg by mouth daily. Swallow whole.     finasteride (PROSCAR) 5 MG tablet Take 5 mg by mouth daily.     Na Sulfate-K Sulfate-Mg Sulf 17.5-3.13-1.6 GM/177ML SOLN Take 1 kit by mouth once for 1 dose. 354 mL 0   pantoprazole (PROTONIX) 40 MG tablet Take 40 mg by mouth daily as needed (acid reflux).     tadalafil (CIALIS) 5 MG tablet Take 5 mg by mouth daily.      No current facility-administered medications for this visit.      ___________________________________________________________________ Objective   Exam:  BP 132/70 (BP Location: Left Arm, Patient Position: Sitting, Cuff Size: Normal)   Pulse 60   Ht 5' 10.5" (1.791 m)   Wt 215 lb 8 oz (97.8 kg)   BMI 30.48 kg/m  Wt Readings from Last 3 Encounters:  03/26/23 215 lb 8 oz (97.8 kg)  10/24/21 223 lb (101.2 kg)  02/08/19 228 lb (103.4 kg)    General: Well-appearing Eyes: sclera anicteric, no redness ENT: oral mucosa moist without lesions, no cervical or supraclavicular lymphadenopathy CV: Regular without appreciable murmur, no JVD, no peripheral edema Resp: clear to auscultation bilaterally, normal RR and effort noted GI: soft, no tenderness, with active bowel sounds. No guarding or palpable organomegaly noted. Skin; warm and dry, no rash or jaundice noted Neuro: awake, alert and oriented x 3. Normal gross motor function and fluent speech  Labs: No recent labs for review.  He says that regular primary care labs have been normal  Assessment: Encounter Diagnosis  Name Primary?   Personal history of colon cancer Yes    My 5-year recall recommendation came from current guidelines, but there is certainly flexibility in that given their understandable concerns about his polyp and cancer history. We have discussed it and decided to proceed with a colonoscopy soon. He was agreeable after discussion of procedure and risks.  The benefits and risks of the planned procedure were described in detail with the patient or (when appropriate) their health care proxy.  Risks were outlined as including, but not limited to, bleeding, infection, perforation, adverse medication reaction leading to cardiac or pulmonary decompensation, pancreatitis (if ERCP).  The limitation of incomplete mucosal visualization was also discussed.  No guarantees or warranties were given.    Thank you for the  courtesy of this consult.  Please call me with any questions or concerns.  Jay Pace  CC: Referring provider noted above

## 2023-03-26 NOTE — Patient Instructions (Signed)
_______________________________________________________  If your blood pressure at your visit was 140/90 or greater, please contact your primary care physician to follow up on this.  _______________________________________________________  If you are age 72 or older, your body mass index should be between 23-30. Your Body mass index is 30.48 kg/m. If this is out of the aforementioned range listed, please consider follow up with your Primary Care Provider.  If you are age 35 or younger, your body mass index should be between 19-25. Your Body mass index is 30.48 kg/m. If this is out of the aformentioned range listed, please consider follow up with your Primary Care Provider.   ________________________________________________________  The [facility] providers would like to encourage you to use [facility] to communicate with providers for non-urgent requests or questions.  Due to long hold times on the telephone, sending your provider a message by [facility] may be a faster and more efficient way to get a response.  Please allow 48 business hours for a response.  Please remember that this is for non-urgent requests.  _______________________________________________________  Bonita Quin have been scheduled for a colonoscopy. Please follow written instructions given to you at your visit today.   Please pick up your prep supplies at the pharmacy within the next 1-3 days.  If you use inhalers (even only as needed), please bring them with you on the day of your procedure.  DO NOT TAKE 7 DAYS PRIOR TO TEST- Trulicity (dulaglutide) Ozempic, Wegovy (semaglutide) Mounjaro (tirzepatide) Bydureon Bcise (exanatide extended release)  DO NOT TAKE 1 DAY PRIOR TO YOUR TEST Rybelsus (semaglutide) Adlyxin (lixisenatide) Victoza (liraglutide) Byetta (exanatide) ___________________________________________________________________________  Due to recent changes in healthcare laws, you may see the results of your imaging  and laboratory studies on MyChart before your provider has had a chance to review them.  We understand that in some cases there may be results that are confusing or concerning to you. Not all laboratory results come back in the same time frame and the provider may be waiting for multiple results in order to interpret others.  Please give [place] 48 hours in order for your provider to thoroughly review all the results before contacting the office for clarification of your results.   It was a pleasure to see you today!  Thank you for trusting me with your gastrointestinal care!

## 2023-04-11 ENCOUNTER — Encounter: Payer: Self-pay | Admitting: Gastroenterology

## 2023-04-11 ENCOUNTER — Ambulatory Visit (AMBULATORY_SURGERY_CENTER): Payer: Medicare Other | Admitting: Gastroenterology

## 2023-04-11 VITALS — BP 136/70 | HR 54 | Temp 97.8°F | Resp 16 | Ht 70.5 in | Wt 215.0 lb

## 2023-04-11 DIAGNOSIS — Z85038 Personal history of other malignant neoplasm of large intestine: Secondary | ICD-10-CM

## 2023-04-11 DIAGNOSIS — Z86010 Personal history of colonic polyps: Secondary | ICD-10-CM

## 2023-04-11 DIAGNOSIS — Z08 Encounter for follow-up examination after completed treatment for malignant neoplasm: Secondary | ICD-10-CM

## 2023-04-11 MED ORDER — SODIUM CHLORIDE 0.9 % IV SOLN: 500.0000 mL | Freq: Once | INTRAVENOUS | Status: DC

## 2023-04-11 NOTE — Progress Notes (Signed)
Provation downtime dictation brief colonoscopy report  Hx colon polyp (2017) - diminutive TA Hx CRC (resection 2015) No polyps 2016 and 2020  Scope #885 Scope in 1425 Cecum 1428 Scope out 1438  Colo-colonic anastomosis at 40cm Int HR  No polyps  Recall 5 yrs   - Amada Jupiter, MD    [facility]

## 2023-04-11 NOTE — Progress Notes (Signed)
Uneventful anesthetic. Report to pacu rn. Vss. Care resumed by rn. 

## 2023-04-11 NOTE — Patient Instructions (Signed)

## 2023-04-11 NOTE — Progress Notes (Signed)
Pt's states no medical or surgical changes since previsit or office visit. 

## 2023-04-11 NOTE — Progress Notes (Signed)
History and Physical:  This patient presents for endoscopic testing for: Encounter Diagnoses  Name Primary?   Personal history of colon cancer Yes   Personal history of colonic polyps     Clinical details in 03/26/23 office consult note Surveillance colonoscopy today  Patient is otherwise without complaints or active issues today.   Past Medical History: Past Medical History:  Diagnosis Date   Allergic rhinitis    Allergy    Colon cancer (HCC)    Colon polyps    DVT (deep venous thrombosis) (HCC)    in leg in 09/2020 post covid   GERD (gastroesophageal reflux disease)    Hypercholesterolemia    Patient denies   Internal hemorrhoids    Keratoconus    Pneumonia    hx of 1/22 due to covid     Past Surgical History: Past Surgical History:  Procedure Laterality Date   COLONOSCOPY     KNEE ARTHROSCOPY Right    LAPAROSCOPIC PARTIAL COLECTOMY N/A 11/09/2013   Procedure: LAPAROSCOPIC ASSISTED PARTIAL COLECTOMY;  Surgeon: Adolph Pollack, MD;  Location: [facility];  Service: General;  Laterality: N/A;   WISDOM TOOTH EXTRACTION      Allergies: No Known Allergies  Outpatient Meds: Current Outpatient Medications  Medication Sig Dispense Refill   finasteride (PROSCAR) 5 MG tablet Take 5 mg by mouth daily.     Na Sulfate-K Sulfate-Mg Sulf 17.5-3.13-1.6 GM/177ML SOLN Take by mouth.     pantoprazole (PROTONIX) 40 MG tablet Take 40 mg by mouth daily as needed (acid reflux).     tadalafil (CIALIS) 5 MG tablet Take 5 mg by mouth daily.     aspirin EC 81 MG tablet Take 81 mg by mouth daily. Swallow whole.     Current Facility-Administered Medications  Medication Dose Route Frequency Provider Last Rate Last Admin   0.9 %  sodium chloride infusion  500 mL Intravenous Once Danis, Starr Lake III, MD          ___________________________________________________________________ Objective   Exam:  BP (!) 142/72   Pulse (!) 58   Temp 97.8 F (36.6 C) (Temporal)   Ht 5' 10.5" (1.791 m)    Wt 215 lb (97.5 kg)   SpO2 98%   BMI 30.41 kg/m   CV: regular , S1/S2 Resp: clear to auscultation bilaterally, normal RR and effort noted GI: soft, no tenderness, with active bowel sounds.   Assessment: Encounter Diagnoses  Name Primary?   Personal history of colon cancer Yes   Personal history of colonic polyps      Plan: Colonoscopy   The benefits and risks of the planned procedure were described in detail with the patient or (when appropriate) their health care proxy.  Risks were outlined as including, but not limited to, bleeding, infection, perforation, adverse medication reaction leading to cardiac or pulmonary decompensation, pancreatitis (if ERCP).  The limitation of incomplete mucosal visualization was also discussed.  No guarantees or warranties were given.  The patient is appropriate for an endoscopic procedure in the ambulatory setting.   - Amada Jupiter, MD

## 2023-04-14 ENCOUNTER — Telehealth: Payer: Self-pay | Admitting: *Deleted

## 2023-04-14 NOTE — Telephone Encounter (Signed)
  Follow up Call-     04/11/2023    2:10 PM  Call back number  Post procedure Call Back phone  # [phone]  Permission to leave phone message Yes     Patient questions:  Do you have a fever, pain , or abdominal swelling? No. Pain Score  0 *  Have you tolerated food without any problems? Yes.    Have you been able to return to your normal activities? Yes.    Do you have any questions about your discharge instructions: Diet   No. Medications  No. Follow up visit  No.  Do you have questions or concerns about your Care? No.  Actions: * If pain score is 4 or above: No action needed, pain <4.

## 2023-04-14 NOTE — Op Note (Signed)
[facility] Patient Name: Jay Pace Procedure Date: 04/14/2023 4:52 PM MRN: [id number] Endoscopist: Sherilyn Cooter L. Myrtie Neither , MD, [id number] Age: 72 Referring MD:  Date of Birth: 04/04/1951 Gender: Male Account #: [id number] Procedure:                Colonoscopy Indications:              High risk colon cancer surveillance: Personal                            history of adenoma less than 10 mm in size, High                            risk colon cancer surveillance: Personal history of                            colon cancer                           CRC resected in 2015                           no polyps 2016, diminutive TA 2017, no polyps 2020 Medicines:                Monitored Anesthesia Care Procedure:                Pre-Anesthesia Assessment:                           - Prior to the procedure, a History and Physical                            was performed, and patient medications and                            allergies were reviewed. The patient's tolerance of                            previous anesthesia was also reviewed. The risks                            and benefits of the procedure and the sedation                            options and risks were discussed with the patient.                            All questions were answered, and informed consent                            was obtained. Prior Anticoagulants: The patient has                            taken no anticoagulant or antiplatelet agents. ASA  Grade Assessment: II - A patient with mild systemic                            disease. After reviewing the risks and benefits,                            the patient was deemed in satisfactory condition to                            undergo the procedure.                           After obtaining informed consent, the colonoscope                            was passed under direct vision. Throughout the                             procedure, the patient's blood pressure, pulse, and                            oxygen saturations were monitored continuously. The                            CF [id number] #[id number] was introduced through the anus                            and advanced to the the cecum, identified by                            appendiceal orifice and ileocecal valve. The                            colonoscopy was performed without difficulty. The                            patient tolerated the procedure well. The quality                            of the bowel preparation was good. The ileocecal                            valve, appendiceal orifice, and rectum were seen                            but could not be photographed due to a technical                            problem with Provation. Manual times: scope in                            14:25. cecum 14:28, scope out 14:38 Findings:  The perianal and digital rectal examinations were                            normal.                           Repeat examination of right colon under NBI                            performed.                           There was evidence of a prior end-to-side                            colo-colonic anastomosis in the descending colon.                            This was patent and was characterized by healthy                            appearing mucosa.                           Internal hemorrhoids were found.                           The exam was otherwise without abnormality on                            direct and retroflexion views. Complications:            No immediate complications. Estimated Blood Loss:     Estimated blood loss: none. Impression:               - Patent end-to-side colo-colonic anastomosis,                            characterized by healthy appearing mucosa.                           - Internal hemorrhoids.                           - The examination was otherwise normal on direct                             and retroflexion views.                           - No specimens collected. Recommendation:           - Patient has a contact number available for                            emergencies. The signs and symptoms of potential  delayed complications were discussed with the                            patient. Return to normal activities tomorrow.                            Written discharge instructions were provided to the                            patient.                           - Resume previous diet.                           - Continue present medications.                           - Repeat colonoscopy in 5 years for surveillance. Edna Rede L. Myrtie Neither, MD 04/14/2023 5:09:49 PM This report has been signed electronically.

## 2023-06-05 DIAGNOSIS — Z85038 Personal history of other malignant neoplasm of large intestine: Secondary | ICD-10-CM | POA: Diagnosis not present

## 2023-06-05 DIAGNOSIS — Z9181 History of falling: Secondary | ICD-10-CM | POA: Diagnosis not present

## 2023-06-05 DIAGNOSIS — E7800 Pure hypercholesterolemia, unspecified: Secondary | ICD-10-CM | POA: Diagnosis not present

## 2023-06-05 DIAGNOSIS — Z0000 Encounter for general adult medical examination without abnormal findings: Secondary | ICD-10-CM | POA: Diagnosis not present

## 2023-07-23 ENCOUNTER — Other Ambulatory Visit: Payer: Self-pay | Admitting: Gastroenterology

## 2023-10-10 DIAGNOSIS — R3912 Poor urinary stream: Secondary | ICD-10-CM | POA: Diagnosis not present

## 2023-11-04 DIAGNOSIS — R3912 Poor urinary stream: Secondary | ICD-10-CM | POA: Diagnosis not present

## 2023-11-24 DIAGNOSIS — R350 Frequency of micturition: Secondary | ICD-10-CM | POA: Diagnosis not present

## 2023-11-24 DIAGNOSIS — R3912 Poor urinary stream: Secondary | ICD-10-CM | POA: Diagnosis not present

## 2023-12-01 ENCOUNTER — Other Ambulatory Visit: Payer: Self-pay | Admitting: Urology

## 2023-12-22 NOTE — Progress Notes (Signed)
 Anesthesia Review:  PCP: Nolene Ebbs  Cardiologist :  PPM/ ICD: Device Orders: Rep Notified:  Chest x-ray : EKG : Echo : Stress test: Cardiac Cath :   Activity level:  Sleep Study/ CPAP : Fasting Blood Sugar :      / Checks Blood Sugar -- times a day:    Blood Thinner/ Instructions /Last Dose: ASA / Instructions/ Last Dose :

## 2023-12-22 NOTE — Patient Instructions (Signed)
 SURGICAL WAITING ROOM VISITATION  Patients having surgery or a procedure may have no more than 2 support people in the waiting area - these visitors may rotate.    Children under the age of 39 must have an adult with them who is not the patient.  Due to an increase in RSV and influenza rates and associated hospitalizations, children ages 11 and under may not visit patients in [facility] hospitals.  Visitors with respiratory illnesses are discouraged from visiting and should remain at home.  If the patient needs to stay at the hospital during part of their recovery, the visitor guidelines for inpatient rooms apply. Pre-op nurse will coordinate an appropriate time for 1 support person to accompany patient in pre-op.  This support person may not rotate.    Please refer to the [facility] website for the visitor guidelines for Inpatients (after your surgery is over and you are in a regular room).       Your procedure is scheduled on:  12/30/2023    Report to [facility] Main Entrance    Report to admitting at  1115 AM   Call this number if you have problems the morning of surgery [phone]   Do not eat food or drink liquids  :After Midnig                          If you have questions, please contact your surgeon's office.       Oral Hygiene is also important to reduce your risk of infection.                                    Remember - BRUSH YOUR TEETH THE MORNING OF SURGERY WITH YOUR REGULAR TOOTHPASTE  DENTURES WILL BE REMOVED PRIOR TO SURGERY PLEASE DO NOT APPLY "Poly grip" OR ADHESIVES!!!   Do NOT smoke after Midnight   Stop all vitamins and herbal supplements 7 days before surgery.   Take these medicines the morning of surgery with A SIP OF WATER:  allegra if needed, proscar, protonix if needed   DO NOT TAKE ANY ORAL DIABETIC MEDICATIONS DAY OF YOUR SURGERY  Bring CPAP mask and tubing day of surgery.                              You may not have any  metal on your body including hair pins, jewelry, and body piercing             Do not wear make-up, lotions, powders, perfumes/cologne, or deodorant  Do not wear nail polish including gel and S&S, artificial/acrylic nails, or any other type of covering on natural nails including finger and toenails. If you have artificial nails, gel coating, etc. that needs to be removed by a nail salon please have this removed prior to surgery or surgery may need to be canceled/ delayed if the surgeon/ anesthesia feels like they are unable to be safely monitored.   Do not shave  48 hours prior to surgery.               Men may shave face and neck.   Do not bring valuables to the hospital. [facility] IS NOT             RESPONSIBLE   FOR VALUABLES.   Contacts, glasses, dentures  or bridgework may not be worn into surgery.   Bring small overnight bag day of surgery.   DO NOT BRING YOUR HOME MEDICATIONS TO THE HOSPITAL. PHARMACY WILL DISPENSE MEDICATIONS LISTED ON YOUR MEDICATION LIST TO YOU DURING YOUR ADMISSION IN THE HOSPITAL!    Patients discharged on the day of surgery will not be allowed to drive home.  Someone NEEDS to stay with you for the first 24 hours after anesthesia.   Special Instructions: Bring a copy of your healthcare power of attorney and living will documents the day of surgery if you haven't scanned them before.              Please read over the following fact sheets you were given: IF YOU HAVE QUESTIONS ABOUT YOUR PRE-OP INSTRUCTIONS PLEASE CALL [phone]   If you received a COVID test during your pre-op visit  it is requested that you wear a mask when out in public, stay away from anyone that may not be feeling well and notify your surgeon if you develop symptoms. If you test positive for Covid or have been in contact with anyone that has tested positive in the last 10 days please notify you surgeon.    [facility] - Preparing for Surgery Before surgery, you can play an important  role.  Because skin is not sterile, your skin needs to be as free of germs as possible.  You can reduce the number of germs on your skin by washing with CHG (chlorahexidine gluconate) soap before surgery.  CHG is an antiseptic cleaner which kills germs and bonds with the skin to continue killing germs even after washing. Please DO NOT use if you have an allergy to CHG or antibacterial soaps.  If your skin becomes reddened/irritated stop using the CHG and inform your nurse when you arrive at Short Stay. Do not shave (including legs and underarms) for at least 48 hours prior to the first CHG shower.  You may shave your face/neck. Please follow these instructions carefully:  1.  Shower with CHG Soap the night before surgery and the  morning of Surgery.  2.  If you choose to wash your hair, wash your hair first as usual with your  normal  shampoo.  3.  After you shampoo, rinse your hair and body thoroughly to remove the  shampoo.                           4.  Use CHG as you would any other liquid soap.  You can apply chg directly  to the skin and wash                       Gently with a scrungie or clean washcloth.  5.  Apply the CHG Soap to your body ONLY FROM THE NECK DOWN.   Do not use on face/ open                           Wound or open sores. Avoid contact with eyes, ears mouth and genitals (private parts).                       Wash face,  Genitals (private parts) with your normal soap.             6.  Wash thoroughly, paying special attention to the area where your surgery  will be performed.  7.  Thoroughly rinse your body with warm water from the neck down.  8.  DO NOT shower/wash with your normal soap after using and rinsing off  the CHG Soap.                9.  Pat yourself dry with a clean towel.            10.  Wear clean pajamas.            11.  Place clean sheets on your bed the night of your first shower and do not  sleep with pets. Day of Surgery : Do not apply any lotions/deodorants  the morning of surgery.  Please wear clean clothes to the hospital/surgery center.  FAILURE TO FOLLOW THESE INSTRUCTIONS MAY RESULT IN THE CANCELLATION OF YOUR SURGERY PATIENT SIGNATURE_________________________________  NURSE SIGNATURE__________________________________  ________________________________________________________________________

## 2023-12-24 ENCOUNTER — Encounter (HOSPITAL_COMMUNITY)
Admission: RE | Admit: 2023-12-24 | Discharge: 2023-12-24 | Disposition: A | Discharge status: Home / Self Care | Source: Ambulatory Visit | Attending: Urology | Admitting: Urology

## 2023-12-24 ENCOUNTER — Other Ambulatory Visit: Payer: Self-pay

## 2023-12-24 ENCOUNTER — Encounter (HOSPITAL_COMMUNITY): Payer: Self-pay

## 2023-12-24 VITALS — BP 157/67 | HR 49 | Temp 97.8°F | Resp 16 | Ht 70.0 in | Wt 212.0 lb

## 2023-12-24 DIAGNOSIS — Z01818 Encounter for other preprocedural examination: Secondary | ICD-10-CM | POA: Diagnosis not present

## 2023-12-24 LAB — BASIC METABOLIC PANEL WITH GFR
Anion gap: 5 (ref 5–15)
BUN: 23 mg/dL (ref 8–23)
CO2: 25 mmol/L (ref 22–32)
Calcium: 9.1 mg/dL (ref 8.9–10.3)
Chloride: 108 mmol/L (ref 98–111)
Creatinine, Ser: 0.8 mg/dL (ref 0.61–1.24)
GFR, Estimated: >60 mL/min (ref >60)
Glucose, Bld: 104 mg/dL — ABNORMAL HIGH (ref 70–99)
Potassium: 4.5 mmol/L (ref 3.5–5.1)
Sodium: 138 mmol/L (ref 135–145)

## 2023-12-24 LAB — CBC
HCT: 48 % (ref 39.0–52.0)
Hemoglobin: 15.6 g/dL (ref 13.0–17.0)
MCH: 32.1 pg (ref 26.0–34.0)
MCHC: 32.5 g/dL (ref 30.0–36.0)
MCV: 98.8 fL (ref 80.0–100.0)
Platelets: 197 10*3/uL (ref 150–400)
RBC: 4.86 MIL/uL (ref 4.22–5.81)
RDW: 12.3 % (ref 11.5–15.5)
WBC: 5.4 10*3/uL (ref 4.0–10.5)
nRBC: 0 % (ref 0.0–0.2)

## 2023-12-29 NOTE — Anesthesia Preprocedure Evaluation (Signed)
 Anesthesia Evaluation  Patient identified by MRN, date of birth, ID band Patient awake    Reviewed: Allergy & Precautions, NPO status , Patient's Chart, lab work & pertinent test results  Airway Mallampati: I  TM Distance: >3 FB Neck ROM: Full    Dental  (+) Teeth Intact, Dental Advisory Given   Pulmonary neg pulmonary ROS   Pulmonary exam normal breath sounds clear to auscultation       Cardiovascular + DVT (2022, provoked)  Normal cardiovascular exam Rhythm:Regular Rate:Normal     Neuro/Psych negative neurological ROS  negative psych ROS   GI/Hepatic Neg liver ROS,GERD  Medicated and Controlled,,Hx CRC s/p partial colectomy 2015   Endo/Other  negative endocrine ROS  BMI 30  Renal/GU negative Renal ROS  negative genitourinary   Musculoskeletal negative musculoskeletal ROS (+)    Abdominal   Peds  Hematology negative hematology ROS (+)   Anesthesia Other Findings   Reproductive/Obstetrics negative OB ROS                             Anesthesia Physical Anesthesia Plan  ASA: 2  Anesthesia Plan: General   Post-op Pain Management: Tylenol PO (pre-op)*   Induction: Intravenous  PONV Risk Score and Plan: 2 and Ondansetron, Dexamethasone, Midazolam and Treatment may vary due to age or medical condition  Airway Management Planned: LMA  Additional Equipment: None  Intra-op Plan:   Post-operative Plan: Extubation in OR  Informed Consent: I have reviewed the patients History and Physical, chart, labs and discussed the procedure including the risks, benefits and alternatives for the proposed anesthesia with the patient or authorized representative who has indicated his/her understanding and acceptance.     Dental advisory given  Plan Discussed with: CRNA  Anesthesia Plan Comments:        Anesthesia Quick Evaluation

## 2023-12-30 ENCOUNTER — Other Ambulatory Visit: Payer: Self-pay

## 2023-12-30 ENCOUNTER — Encounter (HOSPITAL_COMMUNITY)
Admission: RE | Disposition: A | Discharge status: Home / Self Care | Payer: Self-pay | Source: Home / Self Care | Attending: Urology

## 2023-12-30 ENCOUNTER — Encounter (HOSPITAL_COMMUNITY): Payer: Self-pay | Admitting: Urology

## 2023-12-30 ENCOUNTER — Ambulatory Visit (HOSPITAL_COMMUNITY)
Admission: RE | Admit: 2023-12-30 | Discharge: 2023-12-30 | Disposition: A | Discharge status: Home / Self Care | Attending: Urology | Admitting: Urology

## 2023-12-30 ENCOUNTER — Ambulatory Visit (HOSPITAL_COMMUNITY): Payer: Self-pay | Admitting: Medical

## 2023-12-30 ENCOUNTER — Ambulatory Visit (HOSPITAL_BASED_OUTPATIENT_CLINIC_OR_DEPARTMENT_OTHER): Admitting: Anesthesiology

## 2023-12-30 DIAGNOSIS — N401 Enlarged prostate with lower urinary tract symptoms: Secondary | ICD-10-CM

## 2023-12-30 DIAGNOSIS — R350 Frequency of micturition: Secondary | ICD-10-CM

## 2023-12-30 DIAGNOSIS — K219 Gastro-esophageal reflux disease without esophagitis: Secondary | ICD-10-CM | POA: Diagnosis not present

## 2023-12-30 DIAGNOSIS — N400 Enlarged prostate without lower urinary tract symptoms: Secondary | ICD-10-CM

## 2023-12-30 DIAGNOSIS — Z01818 Encounter for other preprocedural examination: Secondary | ICD-10-CM

## 2023-12-30 DIAGNOSIS — Z9049 Acquired absence of other specified parts of digestive tract: Secondary | ICD-10-CM | POA: Diagnosis not present

## 2023-12-30 DIAGNOSIS — R3912 Poor urinary stream: Secondary | ICD-10-CM | POA: Diagnosis not present

## 2023-12-30 DIAGNOSIS — Z79899 Other long term (current) drug therapy: Secondary | ICD-10-CM | POA: Diagnosis not present

## 2023-12-30 DIAGNOSIS — R3914 Feeling of incomplete bladder emptying: Secondary | ICD-10-CM | POA: Diagnosis not present

## 2023-12-30 DIAGNOSIS — Z86718 Personal history of other venous thrombosis and embolism: Secondary | ICD-10-CM | POA: Insufficient documentation

## 2023-12-30 SURGERY — ABLATION, PROSTATE, TRANSURETHRAL, USING WATERJET
Anesthesia: General

## 2023-12-30 MED ORDER — SODIUM CHLORIDE 0.9 % IR SOLN
Status: DC | PRN
  Administered 2023-12-30: 15000 mL

## 2023-12-30 MED ORDER — OXYCODONE HCL 5 MG PO TABS: 5.0000 mg | ORAL_TABLET | Freq: Once | ORAL | Status: DC | PRN

## 2023-12-30 MED ORDER — ACETAMINOPHEN 500 MG PO TABS
1000.0000 mg | ORAL_TABLET | Freq: Once | ORAL | Status: AC
  Administered 2023-12-30: 1000 mg via ORAL
  Filled 2023-12-30: qty 2

## 2023-12-30 MED ORDER — CHLORHEXIDINE GLUCONATE 0.12 % MT SOLN
15.0000 mL | Freq: Once | OROMUCOSAL | Status: AC
  Administered 2023-12-30: 15 mL via OROMUCOSAL

## 2023-12-30 MED ORDER — MIDAZOLAM HCL 5 MG/5ML IJ SOLN
INTRAMUSCULAR | Status: DC | PRN
  Administered 2023-12-30: 2 mg via INTRAVENOUS

## 2023-12-30 MED ORDER — ROCURONIUM BROMIDE 100 MG/10ML IV SOLN
INTRAVENOUS | Status: DC | PRN
  Administered 2023-12-30: 60 mg via INTRAVENOUS

## 2023-12-30 MED ORDER — FENTANYL CITRATE (PF) 100 MCG/2ML IJ SOLN
INTRAMUSCULAR | Status: DC | PRN
  Administered 2023-12-30: 50 ug via INTRAVENOUS
  Administered 2023-12-30 (×2): 25 ug via INTRAVENOUS

## 2023-12-30 MED ORDER — LACTATED RINGERS IV SOLN: INTRAVENOUS | Status: DC

## 2023-12-30 MED ORDER — MIDAZOLAM HCL 2 MG/2ML IJ SOLN
INTRAMUSCULAR | Status: AC
  Filled 2023-12-30: qty 2

## 2023-12-30 MED ORDER — FENTANYL CITRATE (PF) 100 MCG/2ML IJ SOLN
INTRAMUSCULAR | Status: AC
  Filled 2023-12-30: qty 2

## 2023-12-30 MED ORDER — STERILE WATER FOR IRRIGATION IR SOLN
Status: DC | PRN
  Administered 2023-12-30: 500 mL

## 2023-12-30 MED ORDER — LIDOCAINE HCL (PF) 2 % IJ SOLN
INTRAMUSCULAR | Status: AC
  Filled 2023-12-30: qty 5

## 2023-12-30 MED ORDER — EPHEDRINE SULFATE (PRESSORS) 50 MG/ML IJ SOLN
INTRAMUSCULAR | Status: DC | PRN
  Administered 2023-12-30 (×2): 5 mg via INTRAVENOUS

## 2023-12-30 MED ORDER — OXYCODONE HCL 5 MG/5ML PO SOLN: 5.0000 mg | Freq: Once | ORAL | Status: DC | PRN

## 2023-12-30 MED ORDER — 0.9 % SODIUM CHLORIDE (POUR BTL) OPTIME
TOPICAL | Status: DC | PRN
  Administered 2023-12-30: 1000 mL

## 2023-12-30 MED ORDER — DEXAMETHASONE SODIUM PHOSPHATE 10 MG/ML IJ SOLN
INTRAMUSCULAR | Status: DC | PRN
  Administered 2023-12-30: 10 mg via INTRAVENOUS

## 2023-12-30 MED ORDER — SODIUM CHLORIDE 0.9 % IV SOLN
2.0000 g | INTRAVENOUS | Status: AC
  Administered 2023-12-30: 2 g via INTRAVENOUS
  Filled 2023-12-30: qty 20

## 2023-12-30 MED ORDER — SUGAMMADEX SODIUM 200 MG/2ML IV SOLN
INTRAVENOUS | Status: DC | PRN
Start: 2023-12-30 — End: 2023-12-30
  Administered 2023-12-30: 200 mg via INTRAVENOUS

## 2023-12-30 MED ORDER — DEXAMETHASONE SODIUM PHOSPHATE 10 MG/ML IJ SOLN
INTRAMUSCULAR | Status: AC
  Filled 2023-12-30: qty 1

## 2023-12-30 MED ORDER — ORAL CARE MOUTH RINSE: 15.0000 mL | Freq: Once | OROMUCOSAL | Status: AC

## 2023-12-30 MED ORDER — TRANEXAMIC ACID-NACL 1000-0.7 MG/100ML-% IV SOLN
1000.0000 mg | INTRAVENOUS | Status: AC
  Administered 2023-12-30: 1000 mg via INTRAVENOUS
  Filled 2023-12-30: qty 100

## 2023-12-30 MED ORDER — ONDANSETRON HCL 4 MG/2ML IJ SOLN
INTRAMUSCULAR | Status: AC
  Filled 2023-12-30: qty 2

## 2023-12-30 MED ORDER — HYDROMORPHONE HCL 1 MG/ML IJ SOLN
INTRAMUSCULAR | Status: AC
  Filled 2023-12-30: qty 1

## 2023-12-30 MED ORDER — HYDROMORPHONE HCL 1 MG/ML IJ SOLN
0.2500 mg | INTRAMUSCULAR | Status: DC | PRN
  Administered 2023-12-30: 0.5 mg via INTRAVENOUS

## 2023-12-30 MED ORDER — NITROFURANTOIN MONOHYD MACRO 100 MG PO CAPS: 100.0000 mg | ORAL_CAPSULE | Freq: Every day | ORAL | 0 refills | Status: AC

## 2023-12-30 MED ORDER — AMISULPRIDE (ANTIEMETIC) 5 MG/2ML IV SOLN: 10.0000 mg | Freq: Once | INTRAVENOUS | Status: DC | PRN

## 2023-12-30 MED ORDER — LIDOCAINE HCL (CARDIAC) PF 100 MG/5ML IV SOSY
PREFILLED_SYRINGE | INTRAVENOUS | Status: DC | PRN
  Administered 2023-12-30: 60 mg via INTRATRACHEAL

## 2023-12-30 MED ORDER — ONDANSETRON HCL 4 MG/2ML IJ SOLN
INTRAMUSCULAR | Status: DC | PRN
  Administered 2023-12-30: 4 mg via INTRAVENOUS

## 2023-12-30 MED ORDER — PROPOFOL 10 MG/ML IV BOLUS
INTRAVENOUS | Status: DC | PRN
  Administered 2023-12-30: 150 mg via INTRAVENOUS

## 2023-12-30 MED ORDER — ONDANSETRON HCL 4 MG/2ML IJ SOLN: 4.0000 mg | Freq: Once | INTRAMUSCULAR | Status: DC | PRN

## 2023-12-30 MED ORDER — ROCURONIUM BROMIDE 10 MG/ML (PF) SYRINGE
PREFILLED_SYRINGE | INTRAVENOUS | Status: AC
  Filled 2023-12-30: qty 10

## 2023-12-30 SURGICAL SUPPLY — 27 items
BAG URINE DRAIN 2000ML AR STRL (UROLOGICAL SUPPLIES) ×2 IMPLANT
BAND RUBBER #18 3X1/16 STRL (MISCELLANEOUS) IMPLANT
CATH HEMA 3WAY 30CC 22FR COUDE (CATHETERS) IMPLANT
CATH HEMA 3WAY 30CC 24FR COUDE (CATHETERS) IMPLANT
COVER MAYO STAND STRL (DRAPES) ×2 IMPLANT
DRAPE FOOT SWITCH (DRAPES) ×2 IMPLANT
DRAPE SURG IRRIG POUCH 19X23 (DRAPES) IMPLANT
GEL ULTRASOUND 8.5O AQUASONIC (MISCELLANEOUS) ×2 IMPLANT
GLOVE SURG LX STRL 7.5 STRW (GLOVE) ×2 IMPLANT
GOWN STRL REUS W/ TWL XL LVL3 (GOWN DISPOSABLE) ×2 IMPLANT
HANDPIECE AQUABEAM (MISCELLANEOUS) ×2 IMPLANT
HOLDER FOLEY CATH W/STRAP (MISCELLANEOUS) IMPLANT
KIT TURNOVER KIT A (KITS) IMPLANT
LOOP CUT BIPOLAR 24F LRG (ELECTROSURGICAL) IMPLANT
MANIFOLD NEPTUNE II (INSTRUMENTS) ×2 IMPLANT
MAT ABSORB FLUID 56X50 GRAY (MISCELLANEOUS) ×2 IMPLANT
PACK CYSTO (CUSTOM PROCEDURE TRAY) ×2 IMPLANT
PACK DRAPE AQUABEAM (MISCELLANEOUS) ×2 IMPLANT
PAD PREP 24X48 CUFFED NSTRL (MISCELLANEOUS) ×2 IMPLANT
PIN SAFETY STERILE (MISCELLANEOUS) IMPLANT
SYR 30ML LL (SYRINGE) ×2 IMPLANT
SYR TOOMEY IRRIG 70ML (MISCELLANEOUS) ×2 IMPLANT
SYRINGE TOOMEY IRRIG 70ML (MISCELLANEOUS) ×4 IMPLANT
TOWEL OR 17X26 10 PK STRL BLUE (TOWEL DISPOSABLE) ×2 IMPLANT
TUBING CONNECTING 10 (TUBING) ×4 IMPLANT
TUBING UROLOGY SET (TUBING) ×2 IMPLANT
UNDERPAD 30X36 HEAVY ABSORB (UNDERPADS AND DIAPERS) ×2 IMPLANT

## 2023-12-30 NOTE — Op Note (Signed)
 Preoperative diagnosis: BPH with lower urinary tract symptoms, weak stream, frequency  Postoperative diagnosis: Same   Procedure: Robotic water jet ablation of the prostate   Surgeon: Mena Goes   Anesthesia: General   Indication for procedure:   Findings:  On exam the penis was circumcised without mass or lesion.  The glans and meatus appeared normal.  Scrotum appeared normal.  On DRE prostate was about 50 g and smooth without hard area or nodule.  Cystoscopy revealed lateral lobe hypertrophy with intravesical extension left greater than right.  Look to be more of lateral lobe extension into the bladder.  Description of procedure:  He was brought to the operating room and placed supine on the operating table.  After adequate anesthesia he was placed lithotomy position. Timeout was performed to confirm the patient and procedure. The TRUS Stepper was mounted to the Articulating Arm and secured to OR bed. The ultrasound probe was attached to the stepper. Exam under anesthesia was performed and the TRUS was inserted per rectum.  There was no resistance. The ultrasound probe was aligned, and confirmation made that the prostate is centered and aligned using both transverse and sagittal views. The bladder neck, verumontanum and the central/transition zones were identified.  Genitalia were prepped and draped in the usual sterile fashion. The 32F AQUABEAM Handpiece is inserted into the prostatic urethra and a complete cystoscopic evaluation was performed by inspecting the prostate, bladder, and identifying the location of the verumontanum/external sphincter. The AQUABEAM Handpiece was secured to the Handpiece Articulating Arm. Confirmed alignment of AQUABEAM Handpiece and TRUS Probe to be parallel and colinear. Confirmation that AQUABEAM nozzle is centered and anterior of the bladder neck or the median lobe. The cystoscope was then retracted to visualize the verumontanum and external sphincter and the  cystoscope tip was positioned just proximal to the external sphincter. Reconfirmed alignment of the TRUS probe with the AQUABEAM Handpiece and compression applied with TRUS probe. Horizontal alignment of the Handpiece waterjet nozzle was performed. The Aquablation treatment zones were planned utilizing real-time TRUS to visualize the contour of the prostate and the depth and radial angles of resection were defined in the transverse view. In the sagittal view, the AQUABEAM nozzle is identified and position reg this morning I was given a stent and they had to push in the old laser the position that old laser ones said I was like 0.3 and it will only go to 50 like guys, can be doing this istered with software. The treatment contours were then adjusted to conform to the intended resection margins. The median lobe, bladder neck and verumontanum were marked and confirmed in the treatment contour. The Aquablation Treatment was then started following the resection contour confirmed under ultrasound guidance. TOTAL AQUABLATION RESECTION TIME: 8:38  Once Aquablation resection was complete the 24 French aqua beam handpiece was carefully removed.  The continuous-flow sheath with the visual obturator was passed and then the loop and handle.  The trigone and the ureteral orifices were identified.  Resection of some of the residual median lobe and bladder neck tissue was done.  The bladder neck was identified at 6:00 and this was taken up to 12:00 with fulguration of the bladder neck and prostate for hemostasis.  Slight amount of anterior tissue was resected.  Similarly from 6:00 up to 12:00 on the left side of the bladder neck was identified by resecting some of the ablated tissue to identify the bladder neck and cauterize any bleeding.  Some anterior tissue on the left  was resected. There was more tissue on the left compared to the right. This created excellent hemostasis.  All the chips were evacuated.  Ureteral orifices  again identified and noted to be normal without injury.  The scope was backed out and a 22 [place] hematuria catheter was placed with 30 cc in the balloon.  The balloon was seated at the bladder neck and it was irrigated on light traction and noted to be clear to pink.  He was hooked up to CBI.  He was cleaned up and placed supine.  Catheter was placed on traction.  He was awakened and taken to the cover room in stable condition.  Complications: None  Blood loss: 100 mL  Specimens: None  Drains: 22 French three-way hematuria catheter with 30 cc in the balloon  Disposition: Patient stable to PACU-I spoke to [place] and went over the procedure, postop care and follow-up.

## 2023-12-30 NOTE — Anesthesia Postprocedure Evaluation (Signed)
 Anesthesia Post Note  Patient: TAMARICK KOVALCIK  Procedure(s) Performed: TRANSURETHRAL ABLATION OF THE PROSTATE USING WATERJET     Patient location during evaluation: PACU Anesthesia Type: General Level of consciousness: awake and alert, oriented and patient cooperative Pain management: pain level controlled Vital Signs Assessment: post-procedure vital signs reviewed and stable Respiratory status: spontaneous breathing, nonlabored ventilation and respiratory function stable Cardiovascular status: blood pressure returned to baseline and stable Postop Assessment: no apparent nausea or vomiting Anesthetic complications: no   No notable events documented.  Last Vitals:  Vitals:   12/30/23 1615 12/30/23 1630  BP: (!) 162/76 (!) 156/86  Pulse: (!) 52 (!) 55  Resp: 19 18  Temp:    SpO2: 99% 97%    Last Pain:  Vitals:   12/30/23 1630  TempSrc:   PainSc: 5                  Lannie Fields

## 2023-12-30 NOTE — H&P (Signed)
 H&P  Chief Complaint: BPH, lower urinary tract symptoms  History of Present Illness: Jay Pace is a 73 year old male with a history of BPH and lower urinary tract symptoms.  He is maintained on finasteride ands daily tadalafil.  Prostate measured Feb 2025 pUS with 70 g prostate and 10 g median lobe which can measure around 130 g without considering the contour of the prostate. Prior measurement 170 g.  He presents today for robotic water jet ablation of the prostate.  He has been well without dysuria or gross hematuria.  No cough, cold or congestion.  Past Medical History:  Diagnosis Date   Allergic rhinitis    Allergy    Colon cancer (HCC)    Colon polyps    DVT (deep venous thrombosis) (HCC)    in leg in 09/2020 post covid   GERD (gastroesophageal reflux disease)    Hypercholesterolemia    Patient denies   Internal hemorrhoids    Keratoconus    Pneumonia    hx of 1/22 due to covid   Past Surgical History:  Procedure Laterality Date   COLONOSCOPY     KNEE ARTHROSCOPY Right    LAPAROSCOPIC PARTIAL COLECTOMY N/A 11/09/2013   Procedure: LAPAROSCOPIC ASSISTED PARTIAL COLECTOMY;  Surgeon: Adolph Pollack, MD;  Location: [facility];  Service: General;  Laterality: N/A;   WISDOM TOOTH EXTRACTION      Home Medications:  Medications Prior to Admission  Medication Sig Dispense Refill Last Dose/Taking   fexofenadine (ALLEGRA) 180 MG tablet Take 180 mg by mouth daily as needed for allergies or rhinitis.   Taking As Needed   finasteride (PROSCAR) 5 MG tablet Take 5 mg by mouth daily.   Taking   pantoprazole (PROTONIX) 40 MG tablet Take 40 mg by mouth daily as needed (acid reflux).   Taking As Needed   tadalafil (CIALIS) 5 MG tablet Take 5 mg by mouth daily.   Taking   Allergies: No Known Allergies  Family History  Problem Relation Age of Onset   Heart disease Mother    Stroke Father    Bladder Cancer Father    Cancer Father        bladder   Breast cancer Sister    Cancer Sister         breast and ovarian   Ovarian cancer Sister    Cancer Sister        breast   Breast cancer Sister        x 2   Hypertension Brother    Liver cancer Brother    Stomach cancer Maternal Grandmother    Colon cancer Neg Hx    Colon polyps Neg Hx    Rectal cancer Neg Hx    Esophageal cancer Neg Hx    Social History:  reports that he has never smoked. He has never used smokeless tobacco. He reports that he does not drink alcohol and does not use drugs.  ROS: A complete review of systems was performed.  All systems are negative except for pertinent findings as noted. ROS   Physical Exam:  Vital signs in last 24 hours:   General:  Alert and oriented, No acute distress HEENT: Normocephalic, atraumatic Cardiovascular: Regular rate and rhythm Lungs: Regular rate and effort Abdomen: Soft, nontender, nondistended, no abdominal masses Back: No CVA tenderness Extremities: No edema Neurologic: Grossly intact  Laboratory Data:  No results found for this or any previous visit (from the past 24 hours). No results found for this or any previous  visit (from the past 240 hours). Creatinine: Recent Labs    12/24/23 0839  CREATININE 0.80    Impression/Assessment:  BPH, lower urinary tract symptoms-  Plan:  I again discussed with the patient the nature, potential benefits, risks and alternatives to robotic water jet ablation of the prostate, including side effects of the proposed treatment, the likelihood of the patient achieving the goals of the procedure, and any potential problems that might occur during the procedure or recuperation.  We discussed leaving some tissue to preserve ejaculation versus maximizing voiding without preserving ejaculation.  He would like to maximize voiding.  All questions answered. Patient elects to proceed.     Jerilee Field 12/30/2023

## 2023-12-30 NOTE — Transfer of Care (Signed)
 Immediate Anesthesia Transfer of Care Note  Patient: Jay Pace  Procedure(s) Performed: TRANSURETHRAL ABLATION OF THE PROSTATE USING WATERJET  Patient Location: PACU  Anesthesia Type:General  Level of Consciousness: awake and alert   Airway & Oxygen Therapy: Patient Spontanous Breathing and Patient connected to face mask oxygen  Post-op Assessment: Report given to RN and Post -op Vital signs reviewed and stable  Post vital signs: Reviewed and stable  Last Vitals:  Vitals Value Taken Time  BP 152/90 12/30/23 1508  Temp    Pulse 51 12/30/23 1509  Resp 15 12/30/23 1509  SpO2 100 % 12/30/23 1509  Vitals shown include unfiled device data.  Last Pain:  Vitals:   12/30/23 1142  TempSrc: Oral         Complications: No notable events documented.

## 2023-12-30 NOTE — Anesthesia Procedure Notes (Signed)
 Procedure Name: Intubation Date/Time: 12/30/2023 1:38 PM  Performed by: Donna Bernard, CRNAPre-anesthesia Checklist: Patient identified, Emergency Drugs available, Suction available, Patient being monitored and Timeout performed Patient Re-evaluated:Patient Re-evaluated prior to induction Oxygen Delivery Method: Circle system utilized Preoxygenation: Pre-oxygenation with 100% oxygen Induction Type: IV induction Ventilation: Mask ventilation without difficulty Laryngoscope Size: Miller and 3 Grade View: Grade I Tube type: Oral Tube size: 7.5 mm Number of attempts: 1 Airway Equipment and Method: Stylet Placement Confirmation: positive ETCO2, ETT inserted through vocal cords under direct vision, CO2 detector and breath sounds checked- equal and bilateral Secured at: 24 cm Tube secured with: Tape Dental Injury: Teeth and Oropharynx as per pre-operative assessment

## 2023-12-31 ENCOUNTER — Encounter (HOSPITAL_COMMUNITY): Payer: Self-pay

## 2023-12-31 ENCOUNTER — Emergency Department (HOSPITAL_COMMUNITY)
Admission: EM | Admit: 2023-12-31 | Discharge: 2023-12-31 | Discharge status: Against Medical Advice | Attending: Student | Admitting: Student

## 2023-12-31 ENCOUNTER — Other Ambulatory Visit: Payer: Self-pay

## 2023-12-31 DIAGNOSIS — R319 Hematuria, unspecified: Secondary | ICD-10-CM | POA: Diagnosis not present

## 2023-12-31 DIAGNOSIS — R3915 Urgency of urination: Secondary | ICD-10-CM | POA: Insufficient documentation

## 2023-12-31 DIAGNOSIS — Z9079 Acquired absence of other genital organ(s): Secondary | ICD-10-CM | POA: Diagnosis not present

## 2023-12-31 DIAGNOSIS — Z5321 Procedure and treatment not carried out due to patient leaving prior to being seen by health care provider: Secondary | ICD-10-CM | POA: Diagnosis not present

## 2023-12-31 LAB — CBC WITH DIFFERENTIAL/PLATELET
Abs Immature Granulocytes: 0.06 10*3/uL (ref 0.00–0.07)
Basophils Absolute: 0 10*3/uL (ref 0.0–0.1)
Basophils Relative: 0 %
Eosinophils Absolute: 0 10*3/uL (ref 0.0–0.5)
Eosinophils Relative: 0 %
HCT: 43.7 % (ref 39.0–52.0)
Hemoglobin: 14.5 g/dL (ref 13.0–17.0)
Immature Granulocytes: 1 %
Lymphocytes Relative: 5 %
Lymphs Abs: 0.6 10*3/uL — ABNORMAL LOW (ref 0.7–4.0)
MCH: 31.9 pg (ref 26.0–34.0)
MCHC: 33.2 g/dL (ref 30.0–36.0)
MCV: 96.3 fL (ref 80.0–100.0)
Monocytes Absolute: 0.5 10*3/uL (ref 0.1–1.0)
Monocytes Relative: 4 %
Neutro Abs: 11.4 10*3/uL — ABNORMAL HIGH (ref 1.7–7.7)
Neutrophils Relative %: 90 %
Platelets: 197 10*3/uL (ref 150–400)
RBC: 4.54 MIL/uL (ref 4.22–5.81)
RDW: 12.4 % (ref 11.5–15.5)
WBC: 12.6 10*3/uL — ABNORMAL HIGH (ref 4.0–10.5)
nRBC: 0 % (ref 0.0–0.2)

## 2023-12-31 LAB — URINALYSIS, ROUTINE W REFLEX MICROSCOPIC

## 2023-12-31 LAB — URINALYSIS, MICROSCOPIC (REFLEX): RBC / HPF: >50 RBC/hpf (ref 0–5)

## 2023-12-31 NOTE — ED Triage Notes (Signed)
 Pt reports having surgery for prostate earlier today but reports a lot of blood coming around catheter. Pt reports mild stinging when feeling the urge to urinate, no stinging at baseline.

## 2024-01-01 LAB — SURGICAL PATHOLOGY

## 2024-01-05 DIAGNOSIS — R3912 Poor urinary stream: Secondary | ICD-10-CM | POA: Diagnosis not present

## 2024-01-05 DIAGNOSIS — R350 Frequency of micturition: Secondary | ICD-10-CM | POA: Diagnosis not present

## 2024-01-25 DIAGNOSIS — R300 Dysuria: Secondary | ICD-10-CM | POA: Diagnosis not present

## 2024-01-26 DIAGNOSIS — R8271 Bacteriuria: Secondary | ICD-10-CM | POA: Diagnosis not present

## 2024-01-26 DIAGNOSIS — N453 Epididymo-orchitis: Secondary | ICD-10-CM | POA: Diagnosis not present

## 2024-01-26 DIAGNOSIS — R350 Frequency of micturition: Secondary | ICD-10-CM | POA: Diagnosis not present

## 2024-02-02 DIAGNOSIS — N453 Epididymo-orchitis: Secondary | ICD-10-CM | POA: Diagnosis not present

## 2024-02-02 DIAGNOSIS — R3912 Poor urinary stream: Secondary | ICD-10-CM | POA: Diagnosis not present

## 2024-02-02 DIAGNOSIS — R8271 Bacteriuria: Secondary | ICD-10-CM | POA: Diagnosis not present

## 2024-04-12 DIAGNOSIS — H18601 Keratoconus, unspecified, right eye: Secondary | ICD-10-CM | POA: Diagnosis not present

## 2024-04-12 DIAGNOSIS — H18602 Keratoconus, unspecified, left eye: Secondary | ICD-10-CM | POA: Diagnosis not present

## 2024-06-09 DIAGNOSIS — R3912 Poor urinary stream: Secondary | ICD-10-CM | POA: Diagnosis not present

## 2024-06-17 DIAGNOSIS — Z85038 Personal history of other malignant neoplasm of large intestine: Secondary | ICD-10-CM | POA: Diagnosis not present

## 2024-06-17 DIAGNOSIS — E7800 Pure hypercholesterolemia, unspecified: Secondary | ICD-10-CM | POA: Diagnosis not present

## 2024-06-17 DIAGNOSIS — M7662 Achilles tendinitis, left leg: Secondary | ICD-10-CM | POA: Diagnosis not present

## 2024-06-17 DIAGNOSIS — Z0000 Encounter for general adult medical examination without abnormal findings: Secondary | ICD-10-CM | POA: Diagnosis not present

## 2024-06-17 DIAGNOSIS — K219 Gastro-esophageal reflux disease without esophagitis: Secondary | ICD-10-CM | POA: Diagnosis not present

## 2024-07-01 DIAGNOSIS — E875 Hyperkalemia: Secondary | ICD-10-CM | POA: Diagnosis not present
